# Patient Record
Sex: Female | Born: 1960 | Race: White | Hispanic: No | Marital: Married | Smoking: Never smoker
Health system: Southern US, Community
[De-identification: ages and names within clinical notes are randomized; demographics above are authoritative.]

## PROBLEM LIST (undated history)

## (undated) DIAGNOSIS — T8859XA Other complications of anesthesia, initial encounter: Secondary | ICD-10-CM

## (undated) DIAGNOSIS — T4145XA Adverse effect of unspecified anesthetic, initial encounter: Secondary | ICD-10-CM

## (undated) DIAGNOSIS — K635 Polyp of colon: Secondary | ICD-10-CM

## (undated) DIAGNOSIS — I8390 Asymptomatic varicose veins of unspecified lower extremity: Secondary | ICD-10-CM

## (undated) HISTORY — PX: PELVIC LAPAROSCOPY: SHX162

## (undated) HISTORY — DX: Asymptomatic varicose veins of unspecified lower extremity: I83.90

---

## 1999-09-09 ENCOUNTER — Emergency Department (HOSPITAL_COMMUNITY): Admission: EM | Admit: 1999-09-09 | Discharge: 1999-09-09 | Payer: Self-pay | Admitting: Emergency Medicine

## 2000-11-17 ENCOUNTER — Ambulatory Visit (HOSPITAL_COMMUNITY): Admission: RE | Admit: 2000-11-17 | Discharge: 2000-11-17 | Payer: Self-pay | Admitting: Gastroenterology

## 2002-10-03 ENCOUNTER — Other Ambulatory Visit: Admission: RE | Admit: 2002-10-03 | Discharge: 2002-10-03 | Payer: Self-pay | Admitting: *Deleted

## 2004-05-02 ENCOUNTER — Ambulatory Visit (HOSPITAL_COMMUNITY): Admission: RE | Admit: 2004-05-02 | Discharge: 2004-05-02 | Payer: Self-pay | Admitting: Obstetrics & Gynecology

## 2005-06-12 ENCOUNTER — Ambulatory Visit: Admission: RE | Admit: 2005-06-12 | Discharge: 2005-06-12 | Payer: Self-pay | Admitting: Obstetrics & Gynecology

## 2005-07-02 ENCOUNTER — Ambulatory Visit (HOSPITAL_COMMUNITY): Admission: RE | Admit: 2005-07-02 | Discharge: 2005-07-02 | Payer: Self-pay | Admitting: Obstetrics & Gynecology

## 2005-11-26 ENCOUNTER — Ambulatory Visit (HOSPITAL_COMMUNITY): Admission: RE | Admit: 2005-11-26 | Discharge: 2005-11-26 | Payer: Self-pay | Admitting: Obstetrics & Gynecology

## 2006-06-17 ENCOUNTER — Ambulatory Visit (HOSPITAL_COMMUNITY): Admission: RE | Admit: 2006-06-17 | Discharge: 2006-06-17 | Payer: Self-pay | Admitting: Obstetrics & Gynecology

## 2007-05-24 ENCOUNTER — Other Ambulatory Visit: Admission: RE | Admit: 2007-05-24 | Discharge: 2007-05-24 | Payer: Self-pay | Admitting: Obstetrics & Gynecology

## 2007-06-21 ENCOUNTER — Ambulatory Visit (HOSPITAL_COMMUNITY): Admission: RE | Admit: 2007-06-21 | Discharge: 2007-06-21 | Payer: Self-pay | Admitting: Family Medicine

## 2007-07-21 ENCOUNTER — Ambulatory Visit (HOSPITAL_COMMUNITY): Admission: RE | Admit: 2007-07-21 | Discharge: 2007-07-21 | Payer: Self-pay | Admitting: Family Medicine

## 2007-07-27 ENCOUNTER — Ambulatory Visit (HOSPITAL_COMMUNITY): Admission: RE | Admit: 2007-07-27 | Discharge: 2007-07-27 | Payer: Self-pay | Admitting: Family Medicine

## 2007-11-10 ENCOUNTER — Ambulatory Visit: Payer: Self-pay | Admitting: Internal Medicine

## 2007-11-24 ENCOUNTER — Ambulatory Visit: Payer: Self-pay | Admitting: Internal Medicine

## 2007-11-24 ENCOUNTER — Ambulatory Visit (HOSPITAL_COMMUNITY): Admission: RE | Admit: 2007-11-24 | Discharge: 2007-11-24 | Payer: Self-pay | Admitting: Internal Medicine

## 2007-11-24 HISTORY — PX: COLONOSCOPY: SHX5424

## 2008-02-14 ENCOUNTER — Ambulatory Visit (HOSPITAL_COMMUNITY): Admission: RE | Admit: 2008-02-14 | Discharge: 2008-02-14 | Payer: Self-pay | Admitting: Obstetrics & Gynecology

## 2008-08-17 ENCOUNTER — Ambulatory Visit (HOSPITAL_COMMUNITY): Admission: RE | Admit: 2008-08-17 | Discharge: 2008-08-17 | Payer: Self-pay | Admitting: Family Medicine

## 2009-09-17 ENCOUNTER — Ambulatory Visit (HOSPITAL_COMMUNITY): Admission: RE | Admit: 2009-09-17 | Discharge: 2009-09-17 | Payer: Self-pay | Admitting: Family Medicine

## 2010-08-04 ENCOUNTER — Encounter: Payer: Self-pay | Admitting: Family Medicine

## 2010-10-17 ENCOUNTER — Ambulatory Visit: Payer: Self-pay | Admitting: Gynecology

## 2010-10-31 ENCOUNTER — Ambulatory Visit (INDEPENDENT_AMBULATORY_CARE_PROVIDER_SITE_OTHER): Payer: BC Managed Care – PPO | Admitting: Gynecology

## 2010-10-31 ENCOUNTER — Other Ambulatory Visit: Payer: Self-pay | Admitting: Gynecology

## 2010-10-31 ENCOUNTER — Other Ambulatory Visit (HOSPITAL_COMMUNITY)
Admission: RE | Admit: 2010-10-31 | Discharge: 2010-10-31 | Disposition: A | Discharge status: Home / Self Care | Payer: BC Managed Care – PPO | Source: Ambulatory Visit | Attending: Gynecology | Admitting: Gynecology

## 2010-10-31 DIAGNOSIS — Z124 Encounter for screening for malignant neoplasm of cervix: Secondary | ICD-10-CM | POA: Insufficient documentation

## 2010-10-31 DIAGNOSIS — Z01419 Encounter for gynecological examination (general) (routine) without abnormal findings: Secondary | ICD-10-CM

## 2010-11-06 ENCOUNTER — Other Ambulatory Visit: Payer: Self-pay | Admitting: Gynecology

## 2010-11-06 DIAGNOSIS — Z139 Encounter for screening, unspecified: Secondary | ICD-10-CM

## 2010-11-11 ENCOUNTER — Ambulatory Visit (HOSPITAL_COMMUNITY)
Admission: RE | Admit: 2010-11-11 | Discharge: 2010-11-11 | Disposition: A | Discharge status: Home / Self Care | Payer: BC Managed Care – PPO | Source: Ambulatory Visit | Attending: Gynecology | Admitting: Gynecology

## 2010-11-11 DIAGNOSIS — Z1231 Encounter for screening mammogram for malignant neoplasm of breast: Secondary | ICD-10-CM | POA: Insufficient documentation

## 2010-11-11 DIAGNOSIS — Z139 Encounter for screening, unspecified: Secondary | ICD-10-CM

## 2010-11-26 NOTE — H&P (Signed)
NAME:  Sharon Hayes, Sharon Hayes             ACCOUNT NO.:  [id number]   MEDICAL RECORD NO.:  [id number]          PATIENT TYPE:  AMB   LOCATION:  DAY                           FACILITY:  [facility]   PHYSICIAN:  R. Roetta Sessions, M.D. DATE OF BIRTH:  10-22-1960   DATE OF ADMISSION:  DATE OF DISCHARGE:  LH                              HISTORY & PHYSICAL   CHIEF COMPLAINT:  Family history of colon cancer/iron deficiency.   HISTORY OF PRESENT ILLNESS:  Sharon Hayes is a 50 year old female who  self-referred for history of iron deficiency and family history of colon  cancer, requesting colonoscopy.  She denies any problems at this time,  but was told on September 07, 2007 blood draw that she had a low total  serum iron of 33.  Her hemoglobin was normal at 12.6, hematocrit normal  at 36.8, and MCV 92.3.  White blood cell and platelet counts were fine.  Her TSH was normal.  She denies any problems with rectal bleeding or  melena.  Denies any heavy menstrual bleeding.  Denies any abdominal  pain, nausea, vomiting, heartburn, indigestion, or anorexia.  Denies any  early satiety, dysphagia, or odynophagia.  Denies any diarrhea or  constipation.  Her weight has remained stable.  She did have her first  high-risk screening colonoscopy at age 25.  Her mother was diagnosed  with colon cancer at age 17, and died the following year.   PAST MEDICAL AND SURGICAL HISTORY:  She had a normal colonoscopy at age  89 by Dr. Madilyn Fireman in [place], per her report.  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   CURRENT MEDICATIONS:  Loestrin once daily and Allegra once daily.   ALLERGIES:  No known drug allergies.   FAMILY HISTORY:  Positive for mother with colon cancer diagnosed at age  30 and a sister with colonic adenomatous polyps.  Father deceased at age  37, secondary to pneumonia/CVA.  She has 3 healthy sisters.   SOCIAL HISTORY:  Sharon Hayes is married.  She  has 3 healthy children.  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  She denies any tobacco or drug use.  She consumes a couple of  glasses of wine every couple weeks or so.   REVIEW OF SYSTEMS:  GYN:  She has been on Loestrin for about 5 years.  She has a light flow.  She has regular 30-day menstrual cycles.  Otherwise negative review of systems.  See HPI.   PHYSICAL EXAMINATION:  VITAL SIGNS: Weight 150.5 pounds, height 67  inches, temperature 98.4, blood pressure 90/64, and pulse 56.  GENERAL: Sharon Hayes is a well-developed, well-nourished Caucasian  female in no acute distress.  HEENT: Sclerae clear, nonicteric.  Conjunctivae are pink.  Oropharynx is  pink and moist without lesions.  NECK: Supple without masses or thyromegaly.  CHEST: Heart regular rate and rhythm.  Normal S1 and S2.  No murmurs,  clicks, rubs, or gallops.  LUNGS: Clear to auscultation bilaterally.  ABDOMEN: Positive bowel sounds x4.  No bruits auscultated.  Soft,  nontender, and nondistended without palpable masses or  hepatosplenomegaly.  No rebound tenderness or guarding.  EXTREMITIES: Without clubbing or edema bilaterally.  SKIN: Pink, warm, and dry without any rash or jaundice.   LABORATORY STUDIES:  She had a CMP on September 07, 2007, which was  normal including LFTs.   IMPRESSION:  Sharon Hayes is a 50 year old female with a family history  of colon cancer, who is overdue for high-risk screening.  She also has  iron deficiency with a normal hemoglobin.  Denies any overt  gastrointestinal bleeding.  She is going to need colonoscopy to rule out  colorectal carcinoma.   PLAN:  Colonoscopy with Dr. Jena Gauss in the near future.  I discussed the  procedure including risks and benefits, which include but are not  limited to bleeding, infection, perforation, or drug reaction.  She  agrees with this plan and consent will be obtained.      Sharon Hayes, N.P.      Jonathon Bellows, M.D.  Electronically Signed    KJ/MEDQ  D:  11/10/2007  T:  11/11/2007  Job:  [id number]

## 2010-11-26 NOTE — Op Note (Signed)
NAME:  Sharon Hayes, Sharon Hayes             ACCOUNT NO.:  [id number]   MEDICAL RECORD NO.:  [id number]          PATIENT TYPE:  AMB   LOCATION:  DAY                           FACILITY:  [facility]   PHYSICIAN:  R. Roetta Sessions, M.D. DATE OF BIRTH:  1960/07/20   DATE OF PROCEDURE:  11/24/2007  DATE OF DISCHARGE:                               OPERATIVE REPORT   INDICATIONS FOR PROCEDURE:  The patient is a 50 year old lady with known  lower GI tract symptoms who has a positive family history of colon  cancer in her mother at young age and a sister with colonic adenomas who  has iron deficiency anemia.  Her last colonoscopy was done at age 59  down in [place] by Dr. Madilyn Fireman without significant findings.  Colonoscopy is now being done as high-risk screening maneuver.  This  approach has been discussed with the patient at length.  Potential  risks, benefits, and alternatives have been reviewed, questions  answered, and she is agreeable.  Please see documentation and medical  record.   PROCEDURE NOTE:  O2 saturation, blood pressure, pulse, and respirations  were monitored throughout the entire procedure.   CONSCIOUS SEDATION:  Versed 5 mg IV, Demerol 100 mg IV in divided doses.   INSTRUMENT:  Pentax video chip system.   FINDINGS:  Digital rectal exam revealed no abnormalities.   ENDOSCOPIC FINDINGS:  Prep was good.  Colon:  The colonic mucosa was  surveyed from the rectosigmoid junction through the left transverse,  right colon to the area of appendiceal orifice, the ileocecal valve, and  cecum.  These structures were well seen and photographed for the record.  Terminal ileum was noted to be 10 cm.  From this level, the scope slowly  and cautiously withdrawn.  All previously mentioned mucosal surfaces  were again seen.  The colonic mucosa appeared entirely normal as did the  terminal ileal mucosa.  The scope was pulled down into the rectum where  a thorough examination of the rectal mucosa was  undertaken.  The rectal  vault was somewhat small.  I attempted to retroflex but was unable to do  so, but for the same reason I was able see the rectal mucosa very well  en face.  She was found to have a 6-mm polyp at 15 cm of the anal verge,  otherwise the rectal mucosa appeared normal.  This polyp was removed  with hot snare cautery and was being recovered at the time of the  dictation.  The patient tolerated the procedure very well and was  reactive to endoscopy.   IMPRESSION:  Rectal polyp at 15 cm of the anal verge, status post  resection described above, otherwise normal rectum, colon, and terminal  ileum.   PLAN:  1. We will followup on path when becomes available.  At minimum, I      would recommend a repeat high-risk surveillance colonoscopy in 5      years.  2. Separate issue with iron-deficiency in her [place] origin.      We will go ahead and screen her for celiac disease with a celiac  antibody panel today.  3. Further recommendations to follow.      Jonathon Bellows, M.D.  Electronically Signed     RMR/MEDQ  D:  11/24/2007  T:  11/25/2007  Job:  [id number]   cc:   Marjory Lies, M.D.  Fax: [phone]

## 2010-11-29 NOTE — Procedures (Signed)
[place]  Patient:    Sharon Hayes, Sharon Hayes                      MRN: [id number] Proc. Date: 11/17/00 Adm. Date:  [id number] Attending:  Louie Bun CC:         Tammy R. Collins [place], M.D.   Procedure Report  PROCEDURE PERFORMED:  Colonoscopy  INDICATION FOR PROCEDURE:  Family history of colon cancer at an early age in a first degree relative.  DESCRIPTION OF PROCEDURE:  The patient was placed in the left lateral decubitus position and placed on the pulse monitor with continuous low flow oxygen delivered by nasal cannula.  She was sedated to 60 mg of IV Demerol and 6 mg IV Versed.  The Olympus video colonoscope was inserted into the rectum and advanced to the cecum, confirmed by transillumination of McBurneys point and visualization of the ileocecal valve and appendiceal orifice.  The prep was excellent.  The cecum, ascending, transverse, descending and sigmoid colon all appeared normal with no masses, polyps, diverticula or other mucosal abnormalities.  The rectum likewise appeared normal.  On retroflex view the anus revealed only small internal hemorrhoids.  The colonoscope was then withdrawn and the patient returned to the recovery room in stable condition. She tolerated the procedure well and there were no immediate complications.  IMPRESSION: 1. Small internal hemorrhoids. 2. Otherwise normal colonoscopy.  PLAN:  Repeat colonoscopy in five years. DD:  11/17/00 TD:  11/17/00 Job: [id number]

## 2011-11-11 ENCOUNTER — Other Ambulatory Visit: Payer: Self-pay | Admitting: Gynecology

## 2011-11-11 DIAGNOSIS — Z139 Encounter for screening, unspecified: Secondary | ICD-10-CM

## 2011-11-25 ENCOUNTER — Ambulatory Visit (HOSPITAL_COMMUNITY)
Admission: RE | Admit: 2011-11-25 | Discharge: 2011-11-25 | Disposition: A | Discharge status: Home / Self Care | Payer: BC Managed Care – PPO | Source: Ambulatory Visit | Attending: Gynecology | Admitting: Gynecology

## 2011-11-25 DIAGNOSIS — Z1231 Encounter for screening mammogram for malignant neoplasm of breast: Secondary | ICD-10-CM | POA: Insufficient documentation

## 2011-11-25 DIAGNOSIS — Z139 Encounter for screening, unspecified: Secondary | ICD-10-CM

## 2012-08-28 ENCOUNTER — Other Ambulatory Visit: Payer: Self-pay

## 2012-12-07 ENCOUNTER — Encounter: Payer: Self-pay | Admitting: Internal Medicine

## 2012-12-28 ENCOUNTER — Other Ambulatory Visit (HOSPITAL_COMMUNITY): Payer: Self-pay | Admitting: Oncology

## 2012-12-28 ENCOUNTER — Other Ambulatory Visit (HOSPITAL_COMMUNITY): Payer: Self-pay | Admitting: Family Medicine

## 2012-12-28 DIAGNOSIS — Z139 Encounter for screening, unspecified: Secondary | ICD-10-CM

## 2013-01-18 ENCOUNTER — Ambulatory Visit (HOSPITAL_COMMUNITY)
Admission: RE | Admit: 2013-01-18 | Discharge: 2013-01-18 | Disposition: A | Discharge status: Home / Self Care | Payer: BC Managed Care – PPO | Source: Ambulatory Visit | Attending: Family Medicine | Admitting: Family Medicine

## 2013-01-18 DIAGNOSIS — Z139 Encounter for screening, unspecified: Secondary | ICD-10-CM

## 2013-01-18 DIAGNOSIS — Z1231 Encounter for screening mammogram for malignant neoplasm of breast: Secondary | ICD-10-CM | POA: Insufficient documentation

## 2013-01-20 ENCOUNTER — Other Ambulatory Visit: Payer: Self-pay | Admitting: Family Medicine

## 2013-01-20 DIAGNOSIS — R928 Other abnormal and inconclusive findings on diagnostic imaging of breast: Secondary | ICD-10-CM

## 2013-02-02 ENCOUNTER — Other Ambulatory Visit: Payer: Self-pay | Admitting: Family Medicine

## 2013-02-02 ENCOUNTER — Ambulatory Visit (HOSPITAL_COMMUNITY)
Admission: RE | Admit: 2013-02-02 | Discharge: 2013-02-02 | Disposition: A | Discharge status: Home / Self Care | Payer: BC Managed Care – PPO | Source: Ambulatory Visit | Attending: Family Medicine | Admitting: Family Medicine

## 2013-02-02 ENCOUNTER — Other Ambulatory Visit (HOSPITAL_COMMUNITY): Payer: Self-pay | Admitting: Family Medicine

## 2013-02-02 DIAGNOSIS — R928 Other abnormal and inconclusive findings on diagnostic imaging of breast: Secondary | ICD-10-CM | POA: Insufficient documentation

## 2013-06-07 ENCOUNTER — Emergency Department (HOSPITAL_COMMUNITY)
Admission: EM | Admit: 2013-06-07 | Discharge: 2013-06-07 | Disposition: A | Discharge status: Home / Self Care | Payer: BC Managed Care – PPO | Source: Home / Self Care | Attending: Emergency Medicine | Admitting: Emergency Medicine

## 2013-06-07 ENCOUNTER — Encounter (HOSPITAL_COMMUNITY): Payer: Self-pay | Admitting: Emergency Medicine

## 2013-06-07 DIAGNOSIS — J029 Acute pharyngitis, unspecified: Secondary | ICD-10-CM

## 2013-06-07 DIAGNOSIS — J309 Allergic rhinitis, unspecified: Secondary | ICD-10-CM

## 2013-06-07 LAB — POCT RAPID STREP A: Streptococcus, Group A Screen (Direct): NEGATIVE

## 2013-06-07 NOTE — ED Provider Notes (Signed)
CSN: [id number]     Arrival date & time 06/07/13  1927 History   First MD Initiated Contact with Patient 06/07/13 2007     Chief Complaint  Patient presents with  . Sore Throat  . Headache   (Consider location/radiation/quality/duration/timing/severity/associated sxs/prior Treatment) HPI Comments: 52 yo female presents with allergy drainage, sore throat, mild headache and + exposure to strep. She denies fever. Allergy drainage is clear. She has been taking OTC ibuprofen with mild relief.   Patient is a 52 y.o. female presenting with pharyngitis and headaches.  Sore Throat Associated symptoms include headaches.  Headache Associated symptoms: drainage and sore throat     History reviewed. No pertinent past medical history. History reviewed. No pertinent past surgical history. History reviewed. No pertinent family history. History  Substance Use Topics  . Smoking status: Passive Smoke Exposure - Never Smoker  . Smokeless tobacco: Not on file  . Alcohol Use: Yes   OB History   Grav Para Term Preterm Abortions TAB SAB Ect Mult Living                 Review of Systems  HENT: Positive for postnasal drip and sore throat.   Neurological: Positive for headaches.  All other systems reviewed and are negative.    Allergies  Review of patient's allergies indicates no known allergies.  Home Medications  No current outpatient prescriptions on file. BP 107/64  Pulse 57  Temp(Src) 97.6 F (36.4 C) (Oral)  Resp 12  SpO2 100%  LMP 05/30/2013 Physical Exam  Nursing note and vitals reviewed. Constitutional: She is oriented to person, place, and time. She appears well-developed and well-nourished.  HENT:  Head: Normocephalic and atraumatic.  Right Ear: External ear normal.  Left Ear: External ear normal.  Nose: Nose normal.  Mouth/Throat: Oropharynx is clear and moist. No oropharyngeal exudate.  Clear exudate, minimal erythema, cobble stone appearance  Neck: Normal range of  motion.  Cardiovascular: Normal rate, regular rhythm, normal heart sounds and intact distal pulses.   Pulmonary/Chest: Effort normal and breath sounds normal.  Abdominal: Soft. Bowel sounds are normal. She exhibits no distension and no mass. There is no tenderness. There is no guarding.  Musculoskeletal: Normal range of motion.  Lymphadenopathy:    She has no cervical adenopathy.  Neurological: She is alert and oriented to person, place, and time.  Skin: Skin is warm and dry.  Psychiatric: Judgment normal.    ED Course  Procedures (including critical care time) Labs Review Labs Reviewed  POCT RAPID STREP A ([facility]RG CARE ONLY)   Imaging Review No results found.    MDM   1. Allergic rhinitis   2. Sore throat    Advised OTC ALlegra AD, warm salt water gargles, push H2O. Neg rapid strept will be sent for culture, advised of hygiene/ prevention. F/U if symptoms increase  Berenice Primas, PA-C 06/07/13 2046

## 2013-06-07 NOTE — ED Notes (Signed)
C/o sore throat and headache. Onset yesterday.  States has been around daughter that has strep.  Denies any other symptoms. No otc meds taken for symptoms.

## 2013-06-09 LAB — CULTURE, GROUP A STREP

## 2013-06-10 NOTE — ED Provider Notes (Signed)
Medical screening examination/treatment/procedure(s) were performed by a resident physician or non-physician practitioner and as the supervising physician I was immediately available for consultation/collaboration.  Clementeen Graham, MD    Rodolph Bong, MD 06/10/13 2000

## 2013-07-01 ENCOUNTER — Ambulatory Visit (INDEPENDENT_AMBULATORY_CARE_PROVIDER_SITE_OTHER): Payer: BC Managed Care – PPO | Admitting: Gynecology

## 2013-07-01 ENCOUNTER — Encounter: Payer: Self-pay | Admitting: Gynecology

## 2013-07-01 ENCOUNTER — Other Ambulatory Visit (HOSPITAL_COMMUNITY)
Admission: RE | Admit: 2013-07-01 | Discharge: 2013-07-01 | Disposition: A | Discharge status: Home / Self Care | Payer: BC Managed Care – PPO | Source: Ambulatory Visit | Attending: Gynecology | Admitting: Gynecology

## 2013-07-01 VITALS — BP 118/76 | Ht 66.5 in | Wt 156.4 lb

## 2013-07-01 DIAGNOSIS — N951 Menopausal and female climacteric states: Secondary | ICD-10-CM

## 2013-07-01 DIAGNOSIS — Z01419 Encounter for gynecological examination (general) (routine) without abnormal findings: Secondary | ICD-10-CM

## 2013-07-01 DIAGNOSIS — N9489 Other specified conditions associated with female genital organs and menstrual cycle: Secondary | ICD-10-CM

## 2013-07-01 DIAGNOSIS — N898 Other specified noninflammatory disorders of vagina: Secondary | ICD-10-CM

## 2013-07-01 DIAGNOSIS — N889 Noninflammatory disorder of cervix uteri, unspecified: Secondary | ICD-10-CM

## 2013-07-01 DIAGNOSIS — Z1151 Encounter for screening for human papillomavirus (HPV): Secondary | ICD-10-CM | POA: Insufficient documentation

## 2013-07-01 NOTE — Progress Notes (Signed)
Sharon Hayes 1960-07-19 [id number]   History:    52 y.o.  for annual gyn exam Who has not been seen in the office since 2012. Patient has not been using any form of contraception. The patient is currently menstruating once a month regularly. Patient had an abnormal mammogram screening early this year with follow  up with diagnostic mammogram and appeared to be an asymmetrical fibroglandular dense tissue on the left breast and she is scheduled for followup in 6 months. 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 Patient states her colonoscopy was 6 years ago. Her PCP is Dr. Doristine Counter who has been doing her blood work. Patient's colonoscopy was reported to be normal 6 years ago. Her mother had history of colon cancer at the age of 69.  Past medical history,surgical history, family history and social history were all reviewed and documented in the EPIC chart.  Gynecologic History Patient's last menstrual period was 05/30/2013. Contraception: none Last Pap: 2012. Results were: normal Last mammogram: see above. Results were: see above  Obstetric History OB History  Gravida Para Term Preterm AB SAB TAB Ectopic Multiple Living  4 3   1 1    3     # Outcome Date GA Lbr Len/2nd Weight Sex Delivery Anes PTL Lv  4 SAB           3 PAR           2 PAR           1 PAR                ROS: A ROS was performed and pertinent positives and negatives are included in the history.  GENERAL: No fevers or chills. HEENT: No change in vision, no earache, sore throat or sinus congestion. NECK: No pain or stiffness. CARDIOVASCULAR: No chest pain or pressure. No palpitations. PULMONARY: No shortness of breath, cough or wheeze. GASTROINTESTINAL: No abdominal pain, nausea, vomiting or diarrhea, melena or bright red blood per rectum. GENITOURINARY: No urinary frequency, urgency, hesitancy or dysuria. MUSCULOSKELETAL: No joint or muscle  pain, no back pain, no recent trauma. DERMATOLOGIC: No rash, no itching, no lesions. ENDOCRINE: No polyuria, polydipsia, no heat or cold intolerance. No recent change in weight. HEMATOLOGICAL: No anemia or easy bruising or bleeding. NEUROLOGIC: No headache, seizures, numbness, tingling or weakness. PSYCHIATRIC: No depression, no loss of interest in normal activity or change in sleep pattern.     Exam: chaperone present  BP 118/76  Ht 5' 6.5" (1.689 m)  Wt 156 lb 6.4 oz (70.943 kg)  BMI 24.87 kg/m2  LMP 05/30/2013  Body mass index is 24.87 kg/(m^2).  General appearance : Well developed well nourished female. No acute distress HEENT: Neck supple, trachea midline, no carotid bruits, no thyroidmegaly Lungs: Clear to auscultation, no rhonchi or wheezes, or rib retractions  Heart: Regular rate and rhythm, no murmurs or gallops Breast:Examined in sitting and supine position were symmetrical in appearance, no palpable masses or tenderness,  no skin retraction, no nipple inversion, no nipple discharge, no skin discoloration, no axillary or supraclavicular lymphadenopathy Abdomen: no palpable masses or tenderness, no rebound or guarding Extremities: no edema or skin discoloration or tenderness  Pelvic:  Bartholin, Urethra, Skene Glands: Within normal limits             Vagina: No gross lesions or discharge  Cervix::   Physical Exam  Genitourinary:  and            Uterus  anteverted, normal size, shape and consistency, non-tender and mobile  Adnexa  Without masses or tenderness  Anus and perineum  normal   Rectovaginal  normal sphincter tone without palpated masses or tenderness             Hemoccult cards provided     Assessment/Plan:  52 y.o. 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 Patient will be asked to return to the office in the next few weeks for a colposcopic evaluation. A Pap smear was done today despite the new  guidelines and her having a normal Pap smear in 2012. She was reminded to schedule for colonoscopy because of her strong family history of colon cancer. Hemoccult cards were provided for her to symmetrically office for testing. We will hold off any hormonal treatment for vaginal atrophy until we complete the colposcopic evaluation. She was reminded of the importance of monthly breast exams and to followup with the previously recommended diagnostic mammogram of the left breast. We discussed the importance of calcium and vitamin D in regular exercise for osteoporosis prevention.  Note: This dictation was prepared with  Dragon/digital dictation along withSmart phrase technology. Any transcriptional errors that result from this process are unintentional.   Ok Edwards MD, 1:28 PM 07/01/2013

## 2013-07-01 NOTE — Patient Instructions (Signed)
Hormone Therapy At menopause, your body begins making less estrogen and progesterone hormones. This causes the body to stop having menstrual periods. This is because estrogen and progesterone hormones control your periods and menstrual cycle. A lack of estrogen may cause symptoms such as:  Hot flushes (or hot flashes).  Vaginal dryness.  Dry skin.  Loss of sex drive.  Risk of bone loss (osteoporosis). When this happens, you may choose to take hormone therapy to get back the estrogen lost during menopause. When the hormone estrogen is given alone, it is usually referred to as ET (Estrogen Therapy). When the hormone progestin is combined with estrogen, it is generally called HT (Hormone Therapy). This was formerly known as hormone replacement therapy (HRT). Your caregiver can help you make a decision on what will be best for you. The decision to use HT seems to change often as new studies are done. Many studies do not agree on the benefits of hormone replacement therapy. LIKELY BENEFITS OF HT INCLUDE PROTECTION FROM:  Hot Flushes (also called hot flashes) - A hot flush is a sudden feeling of heat that spreads over the face and body. The skin may redden like a blush. It is connected with sweats and sleep disturbance. Women going through menopause may have hot flushes a few times a month or several times per day depending on the woman.  Osteoporosis (bone loss)- Estrogen helps guard against bone loss. After menopause, a woman's bones slowly lose calcium and become weak and brittle. As a result, bones are more likely to break. The hip, wrist, and spine are affected most often. Hormone therapy can help slow bone loss after menopause. Weight bearing exercise and taking calcium with vitamin D also can help prevent bone loss. There are also medications that your caregiver can prescribe that can help prevent osteoporosis.  Vaginal Dryness - Loss of estrogen causes changes in the vagina. Its lining may  become thin and dry. These changes can cause pain and bleeding during sexual intercourse. Dryness can also lead to infections. This can cause burning and itching. (Vaginal estrogen treatment can help relieve pain, itching, and dryness.)  Urinary Tract Infections are more common after menopause because of lack of estrogen. Some women also develop urinary incontinence because of low estrogen levels in the vagina and bladder.  Possible other benefits of estrogen include a positive effect on mood and short-term memory in women. RISKS AND COMPLICATIONS  Using estrogen alone without progesterone causes the lining of the uterus to grow. This increases the risk of lining of the uterus (endometrial) cancer. Your caregiver should give another hormone called progestin if you have a uterus.  Women who take combined (estrogen and progestin) HT appear to have an increased risk of breast cancer. The risk appears to be small, but increases throughout the time that HT is taken.  Combined therapy also makes the breast tissue slightly denser which makes it harder to read mammograms (breast X-rays).  Combined, estrogen and progesterone therapy can be taken together every day, in which case there may be spotting of blood. HT therapy can be taken cyclically in which case you will have menstrual periods. Cyclically means HT is taken for a set amount of days, then not taken, then this process is repeated.  HT may increase the risk of stroke, heart attack, breast cancer and forming blood clots in your leg.  Transdermal estrogen (estrogen that is absorbed through the skin with a patch or a cream) may have more positive results with:    Cholesterol.  Blood pressure.  Blood clots. Having the following conditions may indicate you should not have HT:  Endometrial cancer.  Liver disease.  Breast cancer.  Heart disease.  History of blood clots.  Stroke. TREATMENT   If you choose to take HT and have a uterus,  usually estrogen and progestin are prescribed.  Your caregiver will help you decide the best way to take the medications.  Possible ways to take estrogen include:  Pills.  Patches.  Gels.  Sprays.  Vaginal estrogen cream, rings and tablets.  It is best to take the lowest dose possible that will help your symptoms and take them for the shortest period of time that you can.  Hormone therapy can help relieve some of the problems (symptoms) that affect women at menopause. Before making a decision about HT, talk to your caregiver about what is best for you. Be well informed and comfortable with your decisions. HOME CARE INSTRUCTIONS   Follow your caregivers advice when taking the medications.  A Pap test is done to screen for cervical cancer.  The first Pap test should be done at age 21.  Between ages 21 and 29, Pap tests are repeated every 2 years.  Beginning at age 30, you are advised to have a Pap test every 3 years as long as your past 3 Pap tests have been normal.  Some women have medical problems that increase the chance of getting cervical cancer. Talk to your caregiver about these problems. It is especially important to talk to your caregiver if a new problem develops soon after your last Pap test. In these cases, your caregiver may recommend more frequent screening and Pap tests.  The above recommendations are the same for women who have or have not gotten the vaccine for HPV (Human Papillomavirus).  If you had a hysterectomy for a problem that was not a cancer or a condition that could lead to cancer, then you no longer need Pap tests. However, even if you no longer need a Pap test, a regular exam is a good idea to make sure no other problems are starting.   If you are between ages 65 and 70, and you have had normal Pap tests going back 10 years, you no longer need Pap tests. However, even if you no longer need a Pap test, a regular exam is a good idea to make sure no  other problems are starting.   If you have had past treatment for cervical cancer or a condition that could lead to cancer, you need Pap tests and screening for cancer for at least 20 years after your treatment.  If Pap tests have been discontinued, risk factors (such as a new sexual partner) need to be re-assessed to determine if screening should be resumed.  Some women may need screenings more often if they are at high risk for cervical cancer.  Get mammograms done as per the advice of your caregiver. SEEK IMMEDIATE MEDICAL CARE IF:  You develop abnormal vaginal bleeding.  You have pain or swelling in your legs, shortness of breath, or chest pain.  You develop dizziness or headaches.  You have lumps or changes in your breasts or armpits.  You have slurred speech.  You develop weakness or numbness of your arms or legs.  You have pain, burning, or bleeding when urinating.  You develop abdominal pain. Document Released: 03/29/2003 Document Revised: 09/22/2011 Document Reviewed: 07/17/2010 Exit[employer] Patient Information 2014 [employer].   Shingles Vaccine What You   Need to Know WHAT IS SHINGLES?  Shingles is a painful skin rash, often with blisters. It is also called Herpes Zoster or just Zoster.  A shingles rash usually appears on one side of the face or body and lasts from 2 to 4 weeks. Its main symptom is pain, which can be quite severe. Other symptoms of shingles can include fever, headache, chills, and upset stomach. Very rarely, a shingles infection can lead to pneumonia, hearing problems, blindness, brain inflammation (encephalitis), or death.  For about 1 person in 5, severe pain can continue even after the rash clears up. This is called post-herpetic neuralgia.  Shingles is caused by the Varicella Zoster virus. This is the same virus that causes chickenpox. Only someone who has had a case of chickenpox or rarely, has gotten chickenpox vaccine, can get shingles.  The virus stays in your body. It can reappear many years later to cause a case of shingles.  You cannot catch shingles from another person with shingles. However, a person who has never had chickenpox (or chickenpox vaccine) could get chickenpox from someone with shingles. This is not very common.  Shingles is far more common in people 50 and older than in younger people. It is also more common in people whose immune systems are weakened because of a disease such as cancer or drugs such as steroids or chemotherapy.  At least 1 million people get shingles per year in the [place]. SHINGLES VACCINE  A vaccine for shingles was licensed in 2006. In clinical trials, the vaccine reduced the risk of shingles by 50%. It can also reduce the pain in people who still get shingles after being vaccinated.  A single dose of shingles vaccine is recommended for adults 60 years of age and older. SOME PEOPLE SHOULD NOT GET SHINGLES VACCINE OR SHOULD WAIT A person should not get shingles vaccine if he or she:  Has ever had a life-threatening allergic reaction to gelatin, the antibiotic neomycin, or any other component of shingles vaccine. Tell your caregiver if you have any severe allergies.  Has a weakened immune system because of current:  AIDS or another disease that affects the immune system.  Treatment with drugs that affect the immune system, such as prolonged use of high-dose steroids.  Cancer treatment, such as radiation or chemotherapy.  Cancer affecting the bone marrow or lymphatic system, such as leukemia or lymphoma.  Is pregnant, or might be pregnant. Women should not become pregnant until at least 4 weeks after getting shingles vaccine. Someone with a minor illness, such as a cold, may be vaccinated. Anyone with a moderate or severe acute illness should usually wait until he or she recovers before getting the vaccine. This includes anyone with a temperature of 101.3 F (38 C) or  higher. WHAT ARE THE RISKS FROM SHINGLES VACCINE?  A vaccine, like any medicine, could possibly cause serious problems, such as severe allergic reactions. However, the risk of a vaccine causing serious harm, or death, is extremely small.  No serious problems have been identified with shingles vaccine. Mild Problems  Redness, soreness, swelling, or itching at the site of the injection (about 1 person in 3).  Headache (about 1 person in 70). Like all vaccines, shingles vaccine is being closely monitored for unusual or severe problems. WHAT IF THERE IS A MODERATE OR SEVERE REACTION? What should I look for? Any unusual condition, such as a severe allergic reaction or a high fever. If a severe allergic reaction occurred, it would   be within a few minutes to an hour after the shot. Signs of a serious allergic reaction can include difficulty breathing, weakness, hoarseness or wheezing, a fast heartbeat, hives, dizziness, paleness, or swelling of the throat. What should I do?  Call your caregiver, or get the person to a caregiver right away.  Tell the caregiver what happened, the date and time it happened, and when the vaccination was given.  Ask the caregiver to report the reaction by filing a Vaccine Adverse Event Reporting System (VAERS) form. Or, you can file this report through the VAERS web site at www.vaers.hhs.gov or by calling 1-800-822-7967. VAERS does not provide medical advice. HOW CAN I LEARN MORE?  Ask your caregiver. He or she can give you the vaccine package insert or suggest other sources of information.  Contact the Centers for Disease Control and Prevention (CDC):  Call 1-800-232-4636 (1-800-CDC-INFO).  Visit the CDC website at www.cdc.gov/vaccines CDC Shingles Vaccine VIS (04/18/08) Document Released: 04/27/2006 Document Revised: 09/22/2011 Document Reviewed: 10/20/2012 ExitCare Patient Information 2014 [employer].   

## 2013-07-11 ENCOUNTER — Other Ambulatory Visit (HOSPITAL_COMMUNITY): Payer: Self-pay | Admitting: Family Medicine

## 2013-07-11 DIAGNOSIS — Z139 Encounter for screening, unspecified: Secondary | ICD-10-CM

## 2013-08-10 ENCOUNTER — Ambulatory Visit (HOSPITAL_COMMUNITY)
Admission: RE | Admit: 2013-08-10 | Discharge: 2013-08-10 | Disposition: A | Discharge status: Home / Self Care | Payer: BC Managed Care – PPO | Source: Ambulatory Visit | Attending: Family Medicine | Admitting: Family Medicine

## 2013-08-10 DIAGNOSIS — N6489 Other specified disorders of breast: Secondary | ICD-10-CM | POA: Insufficient documentation

## 2013-08-10 DIAGNOSIS — Z09 Encounter for follow-up examination after completed treatment for conditions other than malignant neoplasm: Secondary | ICD-10-CM | POA: Insufficient documentation

## 2013-08-10 DIAGNOSIS — Z139 Encounter for screening, unspecified: Secondary | ICD-10-CM

## 2013-08-31 ENCOUNTER — Ambulatory Visit (INDEPENDENT_AMBULATORY_CARE_PROVIDER_SITE_OTHER): Payer: BC Managed Care – PPO | Admitting: Gynecology

## 2013-08-31 ENCOUNTER — Encounter: Payer: Self-pay | Admitting: Gynecology

## 2013-08-31 VITALS — BP 112/70

## 2013-08-31 DIAGNOSIS — N889 Noninflammatory disorder of cervix uteri, unspecified: Secondary | ICD-10-CM

## 2013-08-31 NOTE — Progress Notes (Addendum)
   The patient presented to the office today for colposcopic evaluation as a result of an incidental finding at time of her annual exam in 04/01/2013. It was noted during the pelvic exam did there appear to be a leukoplakic area at the 12:00 position of the ectocervix. 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 The most recent Pap smear done December 2000 4T was normal.  Patient underwent a detail colposcopic evaluation today after applying acetic acid into the vagina the entire vaginal tube was analyzed. There was no lesions noted on the fornices. The endocervical speculum was utilized and the transformation zone was visualized completely. There was no lesions seen. The external genitalia, perineum and pararectal region were inspected as well and no lesions noted.  Patient was reassured. We'll continue to follow her with Pap smears every 3 years according to the new guidelines.

## 2013-08-31 NOTE — Patient Instructions (Signed)
Colposcopy  Colposcopy is a procedure to examine your cervix and vagina, or the area around the outside of your vagina, for abnormalities or signs of disease. The procedure is done using a lighted microscope called a colposcope. Tissue samples may be collected during the colposcopy if your health care provider finds any unusual cells. A colposcopy may be done if a woman has:   An abnormal Pap test. A Pap test is a medical test done to evaluate cells that are on the surface of the cervix.   A Pap test result that is suggestive of human papillomavirus (HPV). This virus can cause genital warts and is linked to the development of cervical cancer.   A sore on her cervix and the results of a Pap test were normal.   Genital warts on the cervix or in or around the outside of the vagina.   A mother who took the drug diethylstilbestrol (DES) while pregnant.   Painful intercourse.   Vaginal bleeding, especially after sexual intercourse.  LET YOUR HEALTH CARE PROVIDER KNOW ABOUT:   Any allergies you have.   All medicines you are taking, including vitamins, herbs, eye drops, creams, and over-the-counter medicines.   Previous problems you or members of your family have had with the use of anesthetics.   Any blood disorders you have.   Previous surgeries you have had.   Medical conditions you have.  RISKS AND COMPLICATIONS  Generally, a colposcopy is a safe procedure. However, as with any procedure, complications can occur. Possible complications include:   Bleeding.   Infection.   Missed lesions.  BEFORE THE PROCEDURE    Tell your health care provider if you have your menstrual period. A colposcopy typically is not done during menstruation.   For 24 hours before the colposcopy, do not:   Douche.   Use tampons.   Use medicines, creams, or suppositories in the vagina.   Have sexual intercourse.  PROCEDURE   During the procedure, you will be lying on your back with your feet in foot rests (stirrups). A warm  metal or plastic instrument (speculum) will be placed in your vagina to keep it open and to allow the health care provider to see the cervix. The colposcope will be placed outside the vagina. It will be used to magnify and examine the cervix, vagina, and the area around the outside of the vagina. A small amount of liquid solution will be placed on the area that is to be viewed. This solution will make it easier to see the abnormal cells. Your health care provider will use tools to suck out mucus and cells from the canal of the cervix. Then he or she will record the location of the abnormal areas.  If a biopsy is done during the procedure, a medicine will usually be given to numb the area (local anesthetic). You may feel mild pain or cramping while the biopsy is done. After the procedure, tissue samples collected during the biopsy will be sent to a lab for analysis.  AFTER THE PROCEDURE   You will be given instructions on when to follow up with your health care provider for your test results. It is important to keep your appointment.  Document Released: 09/20/2002 Document Revised: 03/02/2013 Document Reviewed: 01/27/2013  ExitCare Patient Information 2014 [employer].

## 2014-02-15 ENCOUNTER — Other Ambulatory Visit (HOSPITAL_COMMUNITY): Payer: Self-pay | Admitting: Family Medicine

## 2014-02-15 DIAGNOSIS — N6489 Other specified disorders of breast: Secondary | ICD-10-CM

## 2014-02-15 DIAGNOSIS — Z09 Encounter for follow-up examination after completed treatment for conditions other than malignant neoplasm: Secondary | ICD-10-CM

## 2014-03-14 ENCOUNTER — Ambulatory Visit (HOSPITAL_COMMUNITY)
Admission: RE | Admit: 2014-03-14 | Discharge: 2014-03-14 | Disposition: A | Discharge status: Home / Self Care | Payer: BC Managed Care – PPO | Source: Ambulatory Visit | Attending: Family Medicine | Admitting: Family Medicine

## 2014-03-14 DIAGNOSIS — R928 Other abnormal and inconclusive findings on diagnostic imaging of breast: Secondary | ICD-10-CM | POA: Diagnosis present

## 2014-03-14 DIAGNOSIS — Z09 Encounter for follow-up examination after completed treatment for conditions other than malignant neoplasm: Secondary | ICD-10-CM

## 2014-03-14 DIAGNOSIS — N6489 Other specified disorders of breast: Secondary | ICD-10-CM

## 2014-04-28 ENCOUNTER — Other Ambulatory Visit: Payer: Self-pay

## 2014-05-15 ENCOUNTER — Encounter: Payer: Self-pay | Admitting: Gynecology

## 2014-09-12 ENCOUNTER — Other Ambulatory Visit (HOSPITAL_COMMUNITY): Payer: Self-pay | Admitting: Family Medicine

## 2014-09-12 DIAGNOSIS — Z09 Encounter for follow-up examination after completed treatment for conditions other than malignant neoplasm: Secondary | ICD-10-CM

## 2014-10-03 ENCOUNTER — Ambulatory Visit (HOSPITAL_COMMUNITY)
Admission: RE | Admit: 2014-10-03 | Discharge: 2014-10-03 | Disposition: A | Discharge status: Home / Self Care | Payer: 59 | Source: Ambulatory Visit | Attending: Family Medicine | Admitting: Family Medicine

## 2014-10-03 DIAGNOSIS — Z09 Encounter for follow-up examination after completed treatment for conditions other than malignant neoplasm: Secondary | ICD-10-CM

## 2014-10-03 DIAGNOSIS — R928 Other abnormal and inconclusive findings on diagnostic imaging of breast: Secondary | ICD-10-CM | POA: Insufficient documentation

## 2014-11-20 ENCOUNTER — Other Ambulatory Visit: Payer: Self-pay | Admitting: *Deleted

## 2014-11-20 ENCOUNTER — Other Ambulatory Visit: Payer: Self-pay

## 2014-11-20 ENCOUNTER — Ambulatory Visit (INDEPENDENT_AMBULATORY_CARE_PROVIDER_SITE_OTHER): Payer: 59 | Admitting: Nurse Practitioner

## 2014-11-20 ENCOUNTER — Encounter: Payer: Self-pay | Admitting: Nurse Practitioner

## 2014-11-20 VITALS — BP 105/68 | HR 52 | Temp 97.0°F | Ht 67.0 in | Wt 159.4 lb

## 2014-11-20 DIAGNOSIS — I83893 Varicose veins of bilateral lower extremities with other complications: Secondary | ICD-10-CM

## 2014-11-20 DIAGNOSIS — Z86010 Personal history of colonic polyps: Secondary | ICD-10-CM

## 2014-11-20 MED ORDER — PEG-KCL-NACL-NASULF-NA ASC-C 100 G PO SOLR: 1.0000 | ORAL | Status: DC

## 2014-11-20 NOTE — Progress Notes (Signed)
Primary Care Physician:  Stephens Shire, MD Primary Gastroenterologist:  Dr. Gala Romney  Chief Complaint  Patient presents with  . set up TCS    HPI:   54 year old female presents for surveillance colonoscopy. Patient is high risk due to family history including colon cancer in her mother at a young age and a sister with colonic adenomas. Last colonoscopy 2009 found prep good, rectal polyp that was 15 cm from the anal verge, otherwise normal rectum, colon, and terminal ileum. Recommended high risk surveillance colonoscopy minimum 5 years. Also of note, no path report is available in the system.  Today she states she is doing well. Denies abdominal pain, nausea, vomiting, chest pain, dyspnea, hematochezia, melena. Also denies fever/chills, unintentional weight loss, change in bowel habits. Typically has a bowel movement once every day to every 2 days which is consistent with [facility] 4 and no straining needed. Denies any upper or lower GI symptoms. Of note patient states she has no medical history.  No past medical history on file.  Past Surgical History  Procedure Laterality Date  . Pelvic laparoscopy    . Colonoscopy  11/24/07    [id number] polyp at 15 cm anal verge otherwise normal    No current outpatient prescriptions on file.   No current facility-administered medications for this visit.    Allergies as of 11/20/2014  . (No Known Allergies)    Family History  Problem Relation Age of Onset  . Cancer Mother     colon  . Heart disease Father   . Cancer Maternal Aunt     uterine  . Breast cancer Maternal Aunt     History   Social History  . Marital Status: Married    Spouse Name: N/A  . Number of Children: N/A  . Years of Education: N/A   Occupational History  . Not on file.   Social History Main Topics  . Smoking status: Passive Smoke Exposure - Never Smoker  . Smokeless tobacco: Not on file  . Alcohol Use: Yes  . Drug Use: No  . Sexual Activity: No    Other Topics Concern  . Not on file   Social History Narrative    Review of Systems: General: Negative for anorexia, weight loss, fever, chills, fatigue, weakness. Eyes: Negative for vision changes.  ENT: Negative for hoarseness, difficulty swallowing. CV: Negative for chest pain, angina, palpitations, peripheral edema.  Respiratory: Negative for dyspnea at rest, cough, sputum, wheezing.  GI: See history of present illness. Derm: Negative for rash or itching.  Neuro: Negative for weakness, seizure, memory loss, confusion.  Psych: Negative for anxiety, depression.  Endo: Negative for unusual weight change.  Heme: Negative for bruising or bleeding. Allergy: Negative for rash or hives.    Physical Exam: BP 105/68 mmHg  Pulse 52  Temp(Src) 97 F (36.1 C)  Ht 5\' 7"  (1.702 m)  Wt 159 lb 6.4 oz (72.303 kg)  BMI 24.96 kg/m2  LMP 09/12/2014 General:   Alert and oriented. Pleasant and cooperative. Well-nourished and well-developed.  Head:  Normocephalic and atraumatic. Eyes:  Without icterus, sclera clear and conjunctiva pink.  Ears:  Normal auditory acuity. Neck:  Supple, without mass or thyromegaly. Lungs:  Clear to auscultation bilaterally. No wheezes, rales, or rhonchi. No distress.  Heart:  S1, S2 present without murmurs appreciated.  Abdomen:  +BS, soft, non-tender and non-distended. No HSM noted. No guarding or rebound. No masses appreciated.  Rectal:  Deferred  Msk:  Symmetrical without gross deformities. Normal  posture. Extremities:  Without clubbing or edema. Neurologic:  Alert and  oriented x4;  grossly normal neurologically. Skin:  Intact without significant lesions or rashes. Psych:  Alert and cooperative. Normal mood and affect.     11/20/2014 8:19 AM

## 2014-11-20 NOTE — Patient Instructions (Signed)
1. We will schedule your procedure (colonoscopy) for you. 2. Further recommendations to be based on results of your procedure.

## 2014-11-20 NOTE — Assessment & Plan Note (Signed)
Patient with a high risk for colon cancer due to family history including a mother diagnosed at age 54 with colon cancer as well as a sister with adenomatous polyps. Patient has had 2 colonoscopies starting at age 89, last colonoscopy with a single rectal polyp with pathology unavailable. Recommend 5 year repeat. Patient is currently asymptomatic from a GI standpoint. We will proceed with a surveillance colonoscopy on this high risk individual.  Proceed with TCS with Dr. Gala Romney in near future: the risks, benefits, and alternatives have been discussed with the patient in detail. The patient states understanding and desires to proceed.  Patient is not on any anticoagulants, antidiabetic, long-term pain or anxiety medications.

## 2014-11-23 NOTE — Progress Notes (Signed)
cc'ed to pcp °

## 2014-11-27 ENCOUNTER — Encounter: Payer: Self-pay | Admitting: Vascular Surgery

## 2014-11-28 ENCOUNTER — Other Ambulatory Visit: Payer: Self-pay | Admitting: Vascular Surgery

## 2014-11-28 DIAGNOSIS — I8390 Asymptomatic varicose veins of unspecified lower extremity: Secondary | ICD-10-CM

## 2014-11-29 ENCOUNTER — Ambulatory Visit (HOSPITAL_COMMUNITY)
Admission: RE | Admit: 2014-11-29 | Discharge: 2014-11-29 | Disposition: A | Discharge status: Home / Self Care | Payer: 59 | Source: Ambulatory Visit | Attending: Vascular Surgery | Admitting: Vascular Surgery

## 2014-11-29 ENCOUNTER — Encounter: Payer: Self-pay | Admitting: Vascular Surgery

## 2014-11-29 ENCOUNTER — Ambulatory Visit (INDEPENDENT_AMBULATORY_CARE_PROVIDER_SITE_OTHER): Payer: 59 | Admitting: Vascular Surgery

## 2014-11-29 VITALS — BP 116/68 | HR 47 | Ht 67.0 in | Wt 154.2 lb

## 2014-11-29 DIAGNOSIS — I8393 Asymptomatic varicose veins of bilateral lower extremities: Secondary | ICD-10-CM | POA: Diagnosis not present

## 2014-11-29 DIAGNOSIS — I8390 Asymptomatic varicose veins of unspecified lower extremity: Secondary | ICD-10-CM

## 2014-11-29 DIAGNOSIS — I83893 Varicose veins of bilateral lower extremities with other complications: Secondary | ICD-10-CM

## 2014-11-29 DIAGNOSIS — I868 Varicose veins of other specified sites: Secondary | ICD-10-CM

## 2014-11-29 NOTE — Progress Notes (Signed)
[facility] [place]  Patient name: Sharon Hayes MRN: [id number] DOB: 09-10-1960 Sex: female  REASON FOR CONSULT: Painful varicose veins bilaterally.  HPI: Sharon Hayes is a 54 y.o. 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 She experiences achiness and heaviness in both lower extremities which is aggravated by standing and sitting and relieved with elevation. She denies any previous history of DVT or phlebitis. Her symptoms are more significant on the left side. She states that she has been wearing compression stockings for 20-30 years. She currently wears thigh-high 30-40 mmHg. He is prescribed by her primary care physician. Her symptoms gradually progressed and she wishes to consider her options.  Her past medical history is unremarkable. She denies any history of diabetes, hypertension, hypercholesterolemia, or history of cardiac disease.   No past medical history on file. Family History  Problem Relation Age of Onset  . Colon cancer Mother 22    colon  . Cancer Mother   . Varicose Veins Mother   . Heart disease Father   . Hyperlipidemia Father   . Hypertension Father   . Heart attack Father   . Cancer Maternal Aunt     uterine  . Breast cancer Maternal Aunt   . Colon polyps Sister    SOCIAL HISTORY: History  Substance Use Topics  . Smoking status: Passive Smoke Exposure - Never Smoker  . Smokeless tobacco: Never Used  . Alcohol Use: 1.2 oz/week    2 Shots of liquor, 0 Standard drinks or equivalent per week     Comment: 1-2 glasses of wine approx 4 days a week   No Known Allergies Current Outpatient Prescriptions  Medication Sig Dispense Refill  . peg 3350 powder (MOVIPREP) 100 G SOLR Take 1 kit (200 g total) by mouth as directed. (Patient not taking: Reported on 11/29/2014) 1 kit 0   No current facility-administered medications for this visit.   REVIEW OF SYSTEMS: Valu.Nieves ] denotes positive  finding; [  ] denotes negative finding  CARDIOVASCULAR:  [ ]  chest pain   [ ]  chest pressure   [ ]  palpitations   [ ]  orthopnea   [ ]  dyspnea on exertion   [ ]  claudication   [ ]  rest pain   [ ]  DVT   [ ]  phlebitis PULMONARY:   [ ]  productive cough   [ ]  asthma   [ ]  wheezing NEUROLOGIC:   [ ]  weakness  [ ]  paresthesias  [ ]  aphasia  [ ]  amaurosis  [ ]  dizziness HEMATOLOGIC:   [ ]  bleeding problems   [ ]  clotting disorders MUSCULOSKELETAL:  [ ]  joint pain   [ ]  joint swelling Valu.Nieves ] leg swelling GASTROINTESTINAL: [ ]   blood in stool  [ ]   hematemesis GENITOURINARY:  [ ]   dysuria  [ ]   hematuria PSYCHIATRIC:  [ ]  history of major depression INTEGUMENTARY:  [ ]  rashes  [ ]  ulcers CONSTITUTIONAL:  [ ]  fever   [ ]  chills  PHYSICAL EXAM: Filed Vitals:   11/29/14 1023  BP: 116/68  Pulse: 47  Height: 5' 7"  (1.702 m)  Weight: 154 lb 3.2 oz (69.945 kg)  SpO2: 100%   GENERAL: The patient is a well-nourished female, in no acute distress. The vital signs are documented above. CARDIOVASCULAR: There is a regular rate and rhythm. I do not detect carotid bruits. She has palpable pedal pulses bilaterally. PULMONARY: There is good air exchange  bilaterally without wheezing or rales. ABDOMEN: Soft and non-tender with normal pitched bowel sounds.  MUSCULOSKELETAL: There are no major deformities or cyanosis. NEUROLOGIC: No focal weakness or paresthesias are detected. SKIN: she has spider veins and telangiectasias bilaterally. She has some large truncal varicosities along the medial aspect of her left calf.PSYCHIATRIC: The patient has a normal affect.  DATA:  I have independently interpreted her venous duplex scan. On the left side, there is no evidence of DVT. She does have reflux in the left common femoral vein. There is also reflux in the greater saphenous vein. There is no reflux in the short saphenous vein.  On the right side there is no evidence of DVT. There is some deep vein reflux in the common  femoral vein. There is no reflux in the greater saphenous vein on the right or in the short saphenous vein.  MEDICAL ISSUES:  VARICOSE VEINS OF BILATERAL LOWER EXTREMITIES WITH PAIN: this patient has some mild deep vein reflux bilaterally but most significantly severe reflux in her left greater saphenous vein. Diameters of the vein range from 0.52-0.6 cm. We have discussed the importance of intermittent leg elevation. She has been wearing compression stockings for many years. She wears thigh-high stockings with a gradient of 30-40. I've encouraged her to stay as active as possible. I've encouraged her to avoid prolonged sitting and standing. I think she would be a good candidate for laser ablation of left greater saphenous vein and I will set her up for an appointment with Dr. Kellie Simmering or Dr. Donnetta Hutching to discuss this.  Deitra Mayo [facility] [place]: [phone]

## 2014-11-30 ENCOUNTER — Telehealth: Payer: Self-pay

## 2014-11-30 NOTE — Telephone Encounter (Signed)
Pt works at [employer] Pt called office today wanting to schedule her TCS. Pt states that she wants to have her procedure on a Monday after July 18th. Pt was seen in the office by Walden Field on 11/20/2014.    I instructed to pt that she will have to come in for another office visit prior to her procedure. Pt states that having another office visit is ridiculous and that the NP told her that he would put a note in her chart that another office visit wouldn't be needed. Pt states he didn't even need to have the first office visit.   Sent staff message to Dr. Gala Romney

## 2014-12-05 ENCOUNTER — Ambulatory Visit: Payer: 59 | Admitting: Vascular Surgery

## 2014-12-19 NOTE — Telephone Encounter (Signed)
Called pt and LMOM for her to call office back

## 2014-12-19 NOTE — Telephone Encounter (Signed)
-----   Message from Daneil Dolin, MD sent at 12/15/2014  8:43 AM EDT ----- Regarding: RE: Office Visit If I did not respond to this previously, I think we can accommodate her on her request. ----- Message -----    From: Malyn Aytes M [place], LPN    Sent: 8/78/6767   3:25 PM      To: Mickel Fuchs, MD Subject: Office Visit                                   Pt works at [employer]. Pt called office today wanting to schedule her TCS. Pt states that she wants to have her procedure on a Monday after July 18th. Pt was seen in the office by Walden Field on 11/20/2014.    I instructed to pt that she will have to come in for another office visit prior to her procedure. Pt states that having another office visit is ridiculous and that the NP told her that he would put a note in her chart that another office visit wouldn't be needed. Pt states she didn't even need to have the first office visit.   I know what your policy is on having an office visit within 30 days of procedure is.  Pt was addiment about not coming in.   Please advise.

## 2015-01-03 ENCOUNTER — Other Ambulatory Visit: Payer: Self-pay

## 2015-01-03 ENCOUNTER — Telehealth: Payer: Self-pay

## 2015-01-03 DIAGNOSIS — Z86010 Personal history of colonic polyps: Secondary | ICD-10-CM

## 2015-01-03 MED ORDER — PEG-KCL-NACL-NASULF-NA ASC-C 100 G PO SOLR: 1.0000 | Freq: Once | ORAL | Status: AC

## 2015-01-03 NOTE — Telephone Encounter (Signed)
Pt called to set up her TCS. Per RMR it is ok for pt to be scheduled in July. Pt is past her 30 days. RMR is aware.   Pt denies any changes history.    Pt is set for  01/29/2015 @ 7:30 am. Mailed instructions and called in RX to pharmacy.

## 2015-01-08 NOTE — Telephone Encounter (Signed)
See other office note.

## 2015-01-26 ENCOUNTER — Encounter: Payer: Self-pay | Admitting: Vascular Surgery

## 2015-01-29 ENCOUNTER — Encounter (HOSPITAL_COMMUNITY)
Admission: RE | Disposition: A | Discharge status: Home / Self Care | Payer: Self-pay | Source: Ambulatory Visit | Attending: Internal Medicine

## 2015-01-29 ENCOUNTER — Ambulatory Visit (HOSPITAL_COMMUNITY)
Admission: RE | Admit: 2015-01-29 | Discharge: 2015-01-29 | Disposition: A | Discharge status: Home / Self Care | Payer: 59 | Source: Ambulatory Visit | Attending: Internal Medicine | Admitting: Internal Medicine

## 2015-01-29 ENCOUNTER — Encounter (HOSPITAL_COMMUNITY): Payer: Self-pay | Admitting: *Deleted

## 2015-01-29 DIAGNOSIS — Z860100 Personal history of colon polyps, unspecified: Secondary | ICD-10-CM

## 2015-01-29 DIAGNOSIS — Z7722 Contact with and (suspected) exposure to environmental tobacco smoke (acute) (chronic): Secondary | ICD-10-CM | POA: Diagnosis not present

## 2015-01-29 DIAGNOSIS — Z09 Encounter for follow-up examination after completed treatment for conditions other than malignant neoplasm: Secondary | ICD-10-CM | POA: Insufficient documentation

## 2015-01-29 DIAGNOSIS — Z800 Family history of malignant neoplasm of digestive organs: Secondary | ICD-10-CM | POA: Diagnosis not present

## 2015-01-29 HISTORY — DX: Other complications of anesthesia, initial encounter: T88.59XA

## 2015-01-29 HISTORY — PX: COLONOSCOPY: SHX5424

## 2015-01-29 HISTORY — DX: Polyp of colon: K63.5

## 2015-01-29 HISTORY — DX: Adverse effect of unspecified anesthetic, initial encounter: T41.45XA

## 2015-01-29 SURGERY — COLONOSCOPY
Anesthesia: Moderate Sedation

## 2015-01-29 MED ORDER — SODIUM CHLORIDE 0.9 % IJ SOLN
INTRAMUSCULAR | Status: AC
  Filled 2015-01-29: qty 3

## 2015-01-29 MED ORDER — MIDAZOLAM HCL 5 MG/5ML IJ SOLN
INTRAMUSCULAR | Status: AC
  Filled 2015-01-29: qty 10

## 2015-01-29 MED ORDER — SODIUM CHLORIDE 0.9 % IV SOLN
INTRAVENOUS | Status: DC
  Administered 2015-01-29: 07:00:00 via INTRAVENOUS

## 2015-01-29 MED ORDER — ONDANSETRON HCL 4 MG/2ML IJ SOLN
INTRAMUSCULAR | Status: AC
  Filled 2015-01-29: qty 2

## 2015-01-29 MED ORDER — ONDANSETRON HCL 4 MG/2ML IJ SOLN
INTRAMUSCULAR | Status: DC | PRN
  Administered 2015-01-29: 4 mg via INTRAVENOUS

## 2015-01-29 MED ORDER — MEPERIDINE HCL 100 MG/ML IJ SOLN
INTRAMUSCULAR | Status: DC | PRN
  Administered 2015-01-29: 50 mg via INTRAVENOUS
  Administered 2015-01-29: 25 mg via INTRAVENOUS

## 2015-01-29 MED ORDER — STERILE WATER FOR IRRIGATION IR SOLN
Status: DC | PRN
  Administered 2015-01-29: 08:00:00

## 2015-01-29 MED ORDER — MEPERIDINE HCL 100 MG/ML IJ SOLN
INTRAMUSCULAR | Status: DC
Start: 2015-01-29 — End: 2015-01-29
  Filled 2015-01-29: qty 2

## 2015-01-29 MED ORDER — MIDAZOLAM HCL 5 MG/5ML IJ SOLN
INTRAMUSCULAR | Status: DC | PRN
  Administered 2015-01-29: 2 mg via INTRAVENOUS
  Administered 2015-01-29 (×2): 1 mg via INTRAVENOUS

## 2015-01-29 NOTE — Op Note (Signed)
[facility] [address] [place], [address]   COLONOSCOPY PROCEDURE REPORT  PATIENT: Stephine, Langbehn  MR#: [id number] BIRTHDATE: [birth year] , 85  yrs. old GENDER: female ENDOSCOPIST: R.  Garfield Cornea, MD FACP [facility] REFERRED HU:TMLYY Tollie Pizza, M.D. PROCEDURE DATE:  2015-02-04 PROCEDURE:   Colonoscopy, surveillance INDICATIONS:Personal history of colonic polyps; positive family history of colon cancer and polyps. MEDICATIONS: Versed 4 mg IV and Demerol 75 mg IV in divided doses. Zofran 4 mg IV. ASA CLASS:       Class II  CONSENT: The risks, benefits, alternatives and imponderables including but not limited to bleeding, perforation as well as the possibility of a missed lesion have been reviewed.  The potential for biopsy, lesion removal, etc. have also been discussed. Questions have been answered.  All parties agreeable.  Please see the history and physical in the medical record for more information.  DESCRIPTION OF PROCEDURE:   After the risks benefits and alternatives of the procedure were thoroughly explained, informed consent was obtained.  The digital rectal exam revealed no abnormalities of the rectum.   The EC-3890Li ([id number])  endoscope was introduced through the anus and advanced to the cecum, which was identified by both the appendix and ileocecal valve. No adverse events experienced.   The quality of the prep was adequate  The instrument was then slowly withdrawn as the colon was fully examined. Estimated blood loss is zero unless otherwise noted in this procedure report.      COLON FINDINGS: Normal-appearing rectal mucosa.  Normal-appearing colonic mucosa.  Although prep was adequate, there was greasy residue throughout the colon which got on the lens tip and made sharp visualization of the mucosa challenging.  Retroflexion was performed. .  Withdrawal time=9 minutes 0 seconds.  The scope was withdrawn and the procedure  completed. COMPLICATIONS: There were no immediate complications.  ENDOSCOPIC IMPRESSION: Normal appearing rectum and colon  RECOMMENDATIONS: Repeat colonoscopy in 5 years.  eSigned:  R. Garfield Cornea, MD [facility] 02/04/2015 8:07 AM   cc:  CPT CODES: ICD CODES:  The ICD and CPT codes recommended by this software are interpretations from the data that the clinical staff has captured with the software.  The verification of the translation of this report to the ICD and CPT codes and modifiers is the sole responsibility of the health care institution and practicing physician where this report was generated.  [employer] will not be held responsible for the validity of the ICD and CPT codes included on this report.  AMA assumes no liability for data contained or not contained herein. CPT is a Designer, television/film set of the [employer].  PATIENT NAME:  Audrianna, Driskill MR#: [id number]

## 2015-01-29 NOTE — H&P (Signed)
@LOGO @   Primary Care Physician:  Stephens Shire, MD Primary Gastroenterologist:  Dr. Gala Romney  Pre-Procedure History & Physical: HPI:  Sharon Hayes is a 54 y.o. female is here for a surveillance colonoscopy.  Polyp removed in 2009. No GI symptoms. Positive family history of polyps and colon cancer.  Past Medical History  Diagnosis Date  . Complication of anesthesia     Hard to wake up.  . Colon polyps     Past Surgical History  Procedure Laterality Date  . Pelvic laparoscopy    . Colonoscopy  11/24/07    OBS:JGGEZM polyp at 15 cm anal verge otherwise normal    Prior to Admission medications   Medication Sig Start Date End Date Taking? Authorizing Provider  peg 3350 powder (MOVIPREP) 100 G SOLR Take 1 kit (200 g total) by mouth once. 01/03/15 02/02/15 Yes Daneil Dolin, MD    Allergies as of 01/03/2015  . (No Known Allergies)    Family History  Problem Relation Age of Onset  . Colon cancer Mother 62    colon  . Cancer Mother   . Varicose Veins Mother   . Heart disease Father   . Hyperlipidemia Father   . Hypertension Father   . Heart attack Father   . Cancer Maternal Aunt     uterine  . Breast cancer Maternal Aunt   . Colon polyps Sister     History   Social History  . Marital Status: Married    Spouse Name: N/A  . Number of Children: N/A  . Years of Education: N/A   Occupational History  . Not on file.   Social History Main Topics  . Smoking status: Passive Smoke Exposure - Never Smoker  . Smokeless tobacco: Never Used  . Alcohol Use: 5.4 oz/week    2 Shots of liquor, 0 Standard drinks or equivalent, 7 Glasses of wine per week     Comment: 1-2 glasses of wine approx 4 days a week  . Drug Use: No  . Sexual Activity: No   Other Topics Concern  . Not on file   Social History Narrative    Review of Systems: See HPI, otherwise negative ROS  Physical Exam: BP 110/74 mmHg  Pulse 40  Temp(Src) 97.6 F (36.4 C) (Oral)  Resp 15  Ht 5' 7"   (1.702 m)  Wt 151 lb (68.493 kg)  BMI 23.64 kg/m2  SpO2 100%  LMP 12/30/2014 General:   Alert,  Well-developed, well-nourished, pleasant and cooperative in NAD Head:  Normocephalic and atraumatic. Eyes:  Sclera clear, no icterus.   Conjunctiva pink. Ears:  Normal auditory acuity. Nose:  No deformity, discharge,  or lesions. Mouth:  No deformity or lesions, dentition normal. Neck:  Supple; no masses or thyromegaly. Lungs:  Clear throughout to auscultation.   No wheezes, crackles, or rhonchi. No acute distress. Heart:  Regular rate and rhythm; no murmurs, clicks, rubs,  or gallops. Abdomen:  Soft, nontender and nondistended. No masses, hepatosplenomegaly or hernias noted. Normal bowel sounds, without guarding, and without rebound.   Msk:  Symmetrical without gross deformities. Normal posture. Pulses:  Normal pulses noted. Extremities:  Without clubbing or edema.  Impression/Plan: KITZIA CAMUS is now here to undergo a surveillance colonoscopy. Risks, benefits, limitations, imponderables and alternatives regarding colonoscopy have been reviewed with the patient. Questions have been answered. All parties agreeable.     Notice:  This dictation was prepared with Dragon dictation along with smaller phrase technology. Any transcriptional errors that  result from this process are unintentional and may not be corrected upon review.

## 2015-01-29 NOTE — Discharge Instructions (Signed)

## 2015-01-30 ENCOUNTER — Encounter: Payer: Self-pay | Admitting: Vascular Surgery

## 2015-01-30 ENCOUNTER — Ambulatory Visit (INDEPENDENT_AMBULATORY_CARE_PROVIDER_SITE_OTHER): Payer: 59 | Admitting: Vascular Surgery

## 2015-01-30 VITALS — BP 96/66 | HR 66 | Temp 97.1°F | Resp 18 | Ht 66.75 in | Wt 156.0 lb

## 2015-01-30 DIAGNOSIS — I872 Venous insufficiency (chronic) (peripheral): Secondary | ICD-10-CM | POA: Diagnosis not present

## 2015-01-30 NOTE — Progress Notes (Signed)
Referred by:  Stephens Shire, MD [address] [place], [place] [address]  History of Present Illness  Sharon Hayes is a 54 y.o. (1960-09-09) female who presents for follow-up of bilateral varicose veins, left significantly worse than right. The patient reports heaviness and achiness of her legs that is aggravated with prolonged sitting and standing. She says she is able to tolerate her right leg symptoms. She works as a Community education officer. She has been wearing thigh high 30-40 mmHg compression stockings for the past 20-30 years. She has reported continued progression of her symptoms despite wearing compression stockings. She also elevates her legs for relief.   Past Medical History  Diagnosis Date  . Complication of anesthesia     Hard to wake up.  . Colon polyps   . Varicose veins     Past Surgical History  Procedure Laterality Date  . Pelvic laparoscopy    . Colonoscopy  11/24/07    LPF:XTKWIO polyp at 15 cm anal verge otherwise normal  . Colonoscopy N/A 01/29/2015    Procedure: COLONOSCOPY;  Surgeon: Daneil Dolin, MD;  Location: [facility];  Service: Endoscopy;  Laterality: N/A;  0730    History   Social History  . Marital Status: Married    Spouse Name: N/A  . Number of Children: N/A  . Years of Education: N/A   Occupational History  . Not on file.   Social History Main Topics  . Smoking status: Never Smoker   . Smokeless tobacco: Never Used  . Alcohol Use: 5.4 oz/week    7 Glasses of wine, 2 Shots of liquor, 0 Standard drinks or equivalent per week     Comment: 1-2 glasses of wine approx 4 days a week  . Drug Use: No  . Sexual Activity: No   Other Topics Concern  . Not on file   Social History Narrative    Family History  Problem Relation Age of Onset  . Colon cancer Mother 56    colon  . Cancer Mother   . Varicose Veins Mother   . Heart disease Father   . Hyperlipidemia Father   . Hypertension Father   . Heart attack Father    . Cancer Maternal Aunt     uterine  . Breast cancer Maternal Aunt   . Colon polyps Sister     Current Outpatient Prescriptions on File Prior to Visit  Medication Sig Dispense Refill  . peg 3350 powder (MOVIPREP) 100 G SOLR Take 1 kit (200 g total) by mouth once. (Patient not taking: Reported on 01/30/2015) 1 kit 0   No current facility-administered medications on file prior to visit.    No Known Allergies  Physical Examination Filed Vitals:   01/30/15 1558  BP: 96/66  Pulse: 66  Temp: 97.1 F (36.2 C)  TempSrc: Oral  Resp: 18  Height: 5' 6.75" (1.695 m)  Weight: 156 lb (70.761 kg)  SpO2: 99%   Body mass index is 24.63 kg/(m^2).  General: A&O x 3, WDWN female in NAd  Pulmonary: Non labored breathing  Vascular: Palpable pedal pulses bilaterally. Tortuous varicosities of left medial calf, spider telangectasias bilaterally.   Musculoskeletal: Extremities without ischemic changes   Neurologic: CN 2-12 grossly intact   Psychiatric: Judgment intact, Mood & affect appropriate for pt's clinical situation  Medical Decision Making  Sharon Hayes is a 54 y.o. female who presents with: Bilateral lower extremity chronic venous insufficiency with varicosities, left worse  than right.   The patient has failed conservative therapy with 30-40 mmHg thigh high compression stockings, elevation and ibuprofen. The diameters of the left great saphenous vein range from 0.52-0.6 cm. Recommend left great saphenous vein ablation with stab phlebectomy of varicosities. The procedure, risks and benefits were discussed with the patient and she wishes to proceed. Will submit paperwork for insurance approval. Patient to schedule procedure at her conveniece.   Virgina Jock, PA-C [facility] [place] Office: [phone] Pager: [phone]  01/30/2015, 5:07 PM  This patient was seen and examined in conjunction with Dr. Donnetta Hutching   I have examined the patient, reviewed  and agree with above. Clearly has failed conservative treatment. Excellent candidate for ablation of her great saphenous vein and stab phlebectomy of painful tributary  varicosities. Will proceed as soon as possible  Curt Jews, MD 01/30/2015 5:27 PM

## 2015-01-30 NOTE — Progress Notes (Signed)
Problems with Activities of Daily Living Secondary to Leg Pain  1. Sharon Hayes works 8 hour shifts that require prolonged standing and this is very difficult for her due to leg pain.  2. Sharon Hayes has had to decrease the distances she runs for exercise due to leg pain and swelling.       Failure of  Conservative Therapy:  1. Worn 20-30 mm Hg thigh high compression hose >3 months with no relief of symptoms.  2. Frequently elevates legs-no relief of symptoms  3. Taken Ibuprofen 600 Mg TID with no relief of symptoms.

## 2015-02-15 ENCOUNTER — Other Ambulatory Visit: Payer: Self-pay | Admitting: *Deleted

## 2015-02-15 DIAGNOSIS — I83893 Varicose veins of bilateral lower extremities with other complications: Secondary | ICD-10-CM

## 2015-03-16 ENCOUNTER — Other Ambulatory Visit: Payer: Self-pay | Admitting: *Deleted

## 2015-03-16 DIAGNOSIS — I83893 Varicose veins of bilateral lower extremities with other complications: Secondary | ICD-10-CM

## 2015-04-09 ENCOUNTER — Other Ambulatory Visit (HOSPITAL_COMMUNITY): Payer: Self-pay | Admitting: Family Medicine

## 2015-04-09 DIAGNOSIS — Z09 Encounter for follow-up examination after completed treatment for conditions other than malignant neoplasm: Secondary | ICD-10-CM

## 2015-04-12 ENCOUNTER — Other Ambulatory Visit: Payer: 59 | Admitting: Vascular Surgery

## 2015-04-17 ENCOUNTER — Ambulatory Visit (HOSPITAL_COMMUNITY)
Admission: RE | Admit: 2015-04-17 | Discharge: 2015-04-17 | Disposition: A | Discharge status: Home / Self Care | Payer: 59 | Source: Ambulatory Visit | Attending: Family Medicine | Admitting: Family Medicine

## 2015-04-17 DIAGNOSIS — N6489 Other specified disorders of breast: Secondary | ICD-10-CM | POA: Diagnosis not present

## 2015-04-17 DIAGNOSIS — Z09 Encounter for follow-up examination after completed treatment for conditions other than malignant neoplasm: Secondary | ICD-10-CM

## 2015-04-19 ENCOUNTER — Ambulatory Visit: Payer: 59 | Admitting: Vascular Surgery

## 2015-04-19 ENCOUNTER — Encounter (HOSPITAL_COMMUNITY): Payer: 59

## 2015-05-15 ENCOUNTER — Encounter: Payer: Self-pay | Admitting: Vascular Surgery

## 2015-05-17 ENCOUNTER — Ambulatory Visit (INDEPENDENT_AMBULATORY_CARE_PROVIDER_SITE_OTHER): Payer: 59 | Admitting: Vascular Surgery

## 2015-05-17 ENCOUNTER — Encounter: Payer: Self-pay | Admitting: Vascular Surgery

## 2015-05-17 VITALS — BP 101/74 | HR 57 | Temp 97.7°F | Resp 16 | Ht 67.0 in | Wt 152.0 lb

## 2015-05-17 DIAGNOSIS — I83893 Varicose veins of bilateral lower extremities with other complications: Secondary | ICD-10-CM | POA: Diagnosis not present

## 2015-05-17 HISTORY — PX: ENDOVENOUS ABLATION SAPHENOUS VEIN W/ LASER: SUR449

## 2015-05-17 NOTE — Progress Notes (Signed)
     Laser Ablation Procedure    Date: 05/17/2015   Sharon Hayes DOB:[birth year]  Consent signed: Yes    Surgeon:  Dr. Sherren Mocha Imri Lor  Procedure: Laser Ablation: left Greater Saphenous Vein  BP 101/74 mmHg  Pulse 57  Temp(Src) 97.7 F (36.5 C) (Oral)  Resp 16  Ht 5\' 7"  (1.702 m)  Wt 152 lb (68.947 kg)  BMI 23.80 kg/m2  SpO2 98%  Tumescent Anesthesia: 450 cc 0.9% NaCl with 50 cc Lidocaine HCL with 1% Epi and 15 cc 8.4% NaHCO3  Local Anesthesia: 3 cc Lidocaine HCL and NaHCO3 (ratio 2:1)  15 watts continuous mode        Total energy: 2421 JOULES   Total time: 2:41    Stab Phlebectomy: 10-20 Sites: Calf  Left leg  Patient tolerated procedure well    Description of Procedure:  After marking the course of the secondary varicosities, the patient was placed on the operating table in the supine position, and the left leg was prepped and draped in sterile fashion.   Local anesthetic was administered and under ultrasound guidance the saphenous vein was accessed with a micro needle and guide wire; then the mirco puncture sheath was placed.  A guide wire was inserted saphenofemoral junction , followed by a 5 french sheath.  The position of the sheath and then the laser fiber below the junction was confirmed using the ultrasound.  Tumescent anesthesia was administered along the course of the saphenous vein using ultrasound guidance. The patient was placed in Trendelenburg position and protective laser glasses were placed on patient and staff, and the laser was fired at 15 watts continuous mode advancing 1-64mm/second for a total of 2421 joules.   For stab phlebectomies, local anesthetic was administered at the previously marked varicosities, and tumescent anesthesia was administered around the vessels.  Ten to 20 stab wounds were made using the tip of an 11 blade. And using the vein hook, the phlebectomies were performed using a hemostat to avulse the varicosities.  Adequate hemostasis was  achieved.     Steri strips were applied to the stab wounds and ABD pads and thigh high compression stockings were applied.  Ace wrap bandages were applied over the phlebectomy sites and at the top of the saphenofemoral junction. Blood loss was less than 15 cc.  The patient ambulated out of the operating room having tolerated the procedure well.  Ablation from just below the knee to just below saphenofemoral junction. Multiple stab phlebectomy is in the medial calf and pretibial area down onto her ankle. Tolerated well.

## 2015-05-18 ENCOUNTER — Telehealth: Payer: Self-pay | Admitting: *Deleted

## 2015-05-18 ENCOUNTER — Encounter: Payer: Self-pay | Admitting: Vascular Surgery

## 2015-05-18 NOTE — Telephone Encounter (Signed)
    05/18/2015  Time: 9:37 AM   Patient Name: Sharon Hayes  Patient of: T.F. Early  Procedure:Laser Ablation left saphenous vein and stab phlebectomy 10-20 incisions left leg 05-17-2015    Reached patient at home and checked  Her status  Yes    Comments/Actions Taken: [name] states minimal left leg discomfort and no left leg bleeding or oozing.  Reviewed post procedural instructions with her and reminded her of post laser ablation duplex and VV follow up appointment with Dr. Donnetta Hutching on 05-24-2015.       @SIGNATURE @

## 2015-05-21 ENCOUNTER — Encounter: Payer: Self-pay | Admitting: Vascular Surgery

## 2015-05-24 ENCOUNTER — Ambulatory Visit (HOSPITAL_COMMUNITY)
Admission: RE | Admit: 2015-05-24 | Discharge: 2015-05-24 | Disposition: A | Discharge status: Home / Self Care | Payer: 59 | Source: Ambulatory Visit | Attending: Vascular Surgery | Admitting: Vascular Surgery

## 2015-05-24 ENCOUNTER — Encounter: Payer: Self-pay | Admitting: Vascular Surgery

## 2015-05-24 ENCOUNTER — Ambulatory Visit (INDEPENDENT_AMBULATORY_CARE_PROVIDER_SITE_OTHER): Payer: 59 | Admitting: Vascular Surgery

## 2015-05-24 VITALS — BP 86/53 | HR 48 | Temp 98.1°F | Resp 12 | Ht 67.0 in | Wt 160.0 lb

## 2015-05-24 DIAGNOSIS — I83893 Varicose veins of bilateral lower extremities with other complications: Secondary | ICD-10-CM | POA: Diagnosis not present

## 2015-05-24 NOTE — Progress Notes (Signed)
The patient presents today for one-week follow-up of laser ablation of her left great saphenous vein from her proximal calf to just below the saphenofemoral junction. Also had stab phlebectomy of multiple tributary varicosities throughout her medial calf and ankle.  She does have the usual amount of soreness and bruising. She has been compliant with her graduated compression garments.  On physical exam she's had very nice cosmetic result from the standpoint of her phlebectomy. She has minimal bruising and some thickening in the ablation site.  Duplex today was reviewed with the patient. This shows closure of her left great saphenous vein from the distal insertion site of the proximal calf to 7 mm from the saphenofemoral junction. There is no evidence of DVT  Impression and plan: Stable one-week follow-up after ablation of her great saphenous vein and stab phlebectomy. We'll continue her compression garments one additional week. Will see [place] again on an as-needed basis

## 2015-06-27 ENCOUNTER — Emergency Department (HOSPITAL_COMMUNITY)
Admission: EM | Admit: 2015-06-27 | Discharge: 2015-06-27 | Disposition: A | Discharge status: Home / Self Care | Payer: 59 | Attending: Emergency Medicine | Admitting: Emergency Medicine

## 2015-06-27 ENCOUNTER — Encounter (HOSPITAL_COMMUNITY): Payer: Self-pay | Admitting: Emergency Medicine

## 2015-06-27 DIAGNOSIS — W260XXA Contact with knife, initial encounter: Secondary | ICD-10-CM | POA: Diagnosis not present

## 2015-06-27 DIAGNOSIS — S61210A Laceration without foreign body of right index finger without damage to nail, initial encounter: Secondary | ICD-10-CM | POA: Insufficient documentation

## 2015-06-27 DIAGNOSIS — S61219A Laceration without foreign body of unspecified finger without damage to nail, initial encounter: Secondary | ICD-10-CM

## 2015-06-27 DIAGNOSIS — Z8679 Personal history of other diseases of the circulatory system: Secondary | ICD-10-CM | POA: Diagnosis not present

## 2015-06-27 DIAGNOSIS — Y9389 Activity, other specified: Secondary | ICD-10-CM | POA: Diagnosis not present

## 2015-06-27 DIAGNOSIS — Y9289 Other specified places as the place of occurrence of the external cause: Secondary | ICD-10-CM | POA: Insufficient documentation

## 2015-06-27 DIAGNOSIS — Z86010 Personal history of colonic polyps: Secondary | ICD-10-CM | POA: Insufficient documentation

## 2015-06-27 DIAGNOSIS — Y998 Other external cause status: Secondary | ICD-10-CM | POA: Insufficient documentation

## 2015-06-27 DIAGNOSIS — S6991XA Unspecified injury of right wrist, hand and finger(s), initial encounter: Secondary | ICD-10-CM | POA: Diagnosis present

## 2015-06-27 MED ORDER — LIDOCAINE HCL (PF) 1 % IJ SOLN
5.0000 mL | Freq: Once | INTRAMUSCULAR | Status: DC
Start: 2015-06-27 — End: 2015-06-28
  Filled 2015-06-27: qty 5

## 2015-06-27 NOTE — ED Provider Notes (Signed)
CSN: [id number]     Arrival date & time 06/27/15  2236 History   First MD Initiated Contact with Patient 06/27/15 2238     Chief Complaint  Patient presents with  . Finger Injury     (Consider location/radiation/quality/duration/timing/severity/associated sxs/prior Treatment) Patient is a 54 y.o. female presenting with skin laceration. The history is provided by the patient.  Laceration Location:  Hand Hand laceration location:  R hand Depth:  Cutaneous Bleeding: venous   Time since incident:  40 minutes Laceration mechanism:  Knife Pain details:    Quality:  Throbbing   Severity:  Moderate   Progression:  Worsening Foreign body present:  No foreign bodies Relieved by:  Nothing Worsened by:  Movement Tetanus status:  Up to date   Past Medical History  Diagnosis Date  . Complication of anesthesia     Hard to wake up.  . Colon polyps   . Varicose veins    Past Surgical History  Procedure Laterality Date  . Pelvic laparoscopy    . Colonoscopy  11/24/07    [id number] polyp at 15 cm anal verge otherwise normal  . Colonoscopy N/A 01/29/2015    Procedure: COLONOSCOPY;  Surgeon: Daneil Dolin, MD;  Location: [facility];  Service: Endoscopy;  Laterality: N/A;  0730  . Endovenous ablation saphenous vein w/ laser Left 05-17-2015    endovenous laser ablation left greater saphenous vein and stab phlebectomy 10-20 incisions left leg by Curt Jews MD    Family History  Problem Relation Age of Onset  . Colon cancer Mother 23    colon  . Cancer Mother   . Varicose Veins Mother   . Heart disease Father   . Hyperlipidemia Father   . Hypertension Father   . Heart attack Father   . Cancer Maternal Aunt     uterine  . Breast cancer Maternal Aunt   . Colon polyps Sister    Social History  Substance Use Topics  . Smoking status: Never Smoker   . Smokeless tobacco: Never Used  . Alcohol Use: 5.4 oz/week    7 Glasses of wine, 2 Shots of liquor, 0 Standard drinks or  equivalent per week     Comment: 1-2 glasses of wine approx 4 days a week   OB History    Gravida Para Term Preterm AB TAB SAB Ectopic Multiple Living   4 3   1  1   3      Review of Systems  Constitutional: Negative for activity change.       All ROS Neg except as noted in HPI  HENT: Negative for nosebleeds.   Eyes: Negative for photophobia and discharge.  Respiratory: Negative for cough, shortness of breath and wheezing.   Cardiovascular: Negative for chest pain and palpitations.  Gastrointestinal: Negative for abdominal pain and blood in stool.  Genitourinary: Negative for dysuria, frequency and hematuria.  Musculoskeletal: Negative for back pain, arthralgias and neck pain.  Skin: Negative.   Neurological: Negative for dizziness, seizures and speech difficulty.  Psychiatric/Behavioral: Negative for hallucinations and confusion.      Allergies  Review of patient's allergies indicates no known allergies.  Home Medications   Prior to Admission medications   Not on File   BP 125/76 mmHg  Pulse 59  Temp(Src) 97.6 F (36.4 C) (Oral)  Resp 16  Ht 5\' 7"  (1.702 m)  Wt 68.947 kg  BMI 23.80 kg/m2  SpO2 100% Physical Exam  Constitutional: She is oriented to person, place,  and time. She appears well-developed and well-nourished.  Non-toxic appearance.  HENT:  Head: Normocephalic.  Right Ear: Tympanic membrane and external ear normal.  Left Ear: Tympanic membrane and external ear normal.  Eyes: EOM and lids are normal. Pupils are equal, round, and reactive to light.  Neck: Normal range of motion. Neck supple. Carotid bruit is not present.  Cardiovascular: Normal rate, regular rhythm, normal heart sounds, intact distal pulses and normal pulses.   Pulmonary/Chest: Breath sounds normal. No respiratory distress.  Abdominal: Soft. Bowel sounds are normal. There is no tenderness. There is no guarding.  Musculoskeletal: Normal range of motion.  1.6cm laceration of the  lateral-palmar surface of the mid left ring finger. No bone or tendon involvement.  Lymphadenopathy:       Head (right side): No submandibular adenopathy present.       Head (left side): No submandibular adenopathy present.    She has no cervical adenopathy.  Neurological: She is alert and oriented to person, place, and time. She has normal strength. No cranial nerve deficit or sensory deficit.  Skin: Skin is warm and dry.  Psychiatric: She has a normal mood and affect. Her speech is normal.  Nursing note and vitals reviewed.   ED Course  .Marland KitchenLaceration Repair Date/Time: 06/28/2015 10:32 AM Performed by: Lily Kocher Authorized by: Lily Kocher Consent: Verbal consent obtained. Risks and benefits: risks, benefits and alternatives were discussed Consent given by: patient Patient understanding: patient states understanding of the procedure being performed Patient identity confirmed: arm band Time out: Immediately prior to procedure a "time out" was called to verify the correct patient, procedure, equipment, support staff and site/side marked as required. Body area: upper extremity Location details: right index finger Laceration length: 1.6 cm Foreign bodies: no foreign bodies Tendon involvement: none Nerve involvement: none Local anesthetic: lidocaine 1% without epinephrine Patient sedated: no Preparation: Patient was prepped and draped in the usual sterile fashion. Irrigation solution: saline Amount of cleaning: standard Skin closure: 4-0 nylon Number of sutures: 4 Technique: simple Approximation: close Approximation difficulty: simple Dressing: gauze roll Patient tolerance: Patient tolerated the procedure well with no immediate complications   (including critical care time) Labs Review Labs Reviewed - No data to display  Imaging Review No results found. I have personally reviewed and evaluated these images and lab results as part of my medical decision-making.   EKG  Interpretation None      MDM  Pt sustained laceration of the right ring finger, repaired with 4-0 nylon. No bone or tendon involvement. Pt to have sutures removed in 7 days.   Final diagnoses:  None    *I have reviewed nursing notes, vital signs, and all appropriate lab and imaging results for this patient.[address], PA-C 06/28/15 1053  Noemi Chapel, MD 07/01/15 [phone]

## 2015-06-27 NOTE — ED Notes (Signed)
Pt has laceration to the rt ring finger from knife.

## 2015-06-27 NOTE — Discharge Instructions (Signed)
Please keep your wound clean and dry. Please have your sutures removed in 7 or 8 days. Please see your primary physician, or return to the emergency department if any pus like drainage, red streaking, fever, or signs of advancing infection. Stitches, Staples, or Adhesive Wound Closure Health care providers use stitches (sutures), staples, and certain glue (skin adhesives) to hold skin together while it heals (wound closure). You may need this treatment after you have surgery or if you cut your skin accidentally. These methods help your skin to heal more quickly and make it less likely that you will have a scar. A wound may take several months to heal completely. The type of wound you have determines when your wound gets closed. In most cases, the wound is closed as soon as possible (primary skin closure). Sometimes, closure is delayed so the wound can be cleaned and allowed to heal naturally. This reduces the chance of infection. Delayed closure may be needed if your wound:  Is caused by a bite.  Happened more than 6 hours ago.  Involves loss of skin or the tissues under the skin.  Has dirt or debris in it that cannot be removed.  Is infected. WHAT ARE THE DIFFERENT KINDS OF WOUND CLOSURES? There are many options for wound closure. The one that your health care provider uses depends on how deep and how large your wound is. Adhesive Glue To use this type of glue to close a wound, your health care provider holds the edges of the wound together and paints the glue on the surface of your skin. You may need more than one layer of glue. Then the wound may be covered with a light bandage (dressing). This type of skin closure may be used for small wounds that are not deep (superficial). Using glue for wound closure is less painful than other methods. It does not require a medicine that numbs the area (local anesthetic). This method also leaves nothing to be removed. Adhesive glue is often used for children  and on facial wounds. Adhesive glue cannot be used for wounds that are deep, uneven, or bleeding. It is not used inside of a wound.  Adhesive Strips These strips are made of sticky (adhesive), porous paper. They are applied across your skin edges like a regular adhesive bandage. You leave them on until they fall off. Adhesive strips may be used to close very superficial wounds. They may also be used along with sutures to improve the closure of your skin edges.  Sutures Sutures are the oldest method of wound closure. Sutures can be made from natural substances, such as silk, or from synthetic materials, such as nylon and steel. They can be made from a material that your body can break down as your wound heals (absorbable), or they can be made from a material that needs to be removed from your skin (nonabsorbable). They come in many different strengths and sizes. Your health care provider attaches the sutures to a steel needle on one end. Sutures can be passed through your skin, or through the tissues beneath your skin. Then they are tied and cut. Your skin edges may be closed in one continuous stitch or in separate stitches. Sutures are strong and can be used for all kinds of wounds. Absorbable sutures may be used to close tissues under the skin. The disadvantage of sutures is that they may cause skin reactions that lead to infection. Nonabsorbable sutures need to be removed. Staples When surgical staples are used  to close a wound, the edges of your skin on both sides of the wound are brought close together. A staple is placed across the wound, and an instrument secures the edges together. Staples are often used to close surgical cuts (incisions). Staples are faster to use than sutures, and they cause less skin reaction. Staples need to be removed using a tool that bends the staples away from your skin. HOW DO I CARE FOR MY WOUND CLOSURE?  Take medicines only as directed by your health care  provider.  If you were prescribed an antibiotic medicine for your wound, finish it all even if you start to feel better.  Use ointments or creams only as directed by your health care provider.  Wash your hands with soap and water before and after touching your wound.  Do not soak your wound in water. Do not take baths, swim, or use a hot tub until your health care provider approves.  Ask your health care provider when you can start showering. Cover your wound if directed by your health care provider.  Do not take out your own sutures or staples.  Do not pick at your wound. Picking can cause an infection.  Keep all follow-up visits as directed by your health care provider. This is important. HOW LONG WILL I HAVE MY WOUND CLOSURE?  Leave adhesive glue on your skin until the glue peels away.  Leave adhesive strips on your skin until the strips fall off.  Absorbable sutures will dissolve within several days.  Nonabsorbable sutures and staples must be removed. The location of the wound will determine how long they stay in. This can range from several days to a couple of weeks. WHEN SHOULD I SEEK HELP FOR MY WOUND CLOSURE? Contact your health care provider if:  You have a fever.  You have chills.  You have drainage, redness, swelling, or pain at your wound.  There is a bad smell coming from your wound.  The skin edges of your wound start to separate after your sutures have been removed.  Your wound becomes thick, raised, and darker in color after your sutures come out (scarring).   This information is not intended to replace advice given to you by your health care provider. Make sure you discuss any questions you have with your health care provider.   Document Released: 03/25/2001 Document Revised: 07/21/2014 Document Reviewed: 12/07/2013 Elsevier Interactive Patient Education [employer].

## 2016-01-31 ENCOUNTER — Encounter: Payer: Self-pay | Admitting: Gynecology

## 2016-01-31 ENCOUNTER — Ambulatory Visit (INDEPENDENT_AMBULATORY_CARE_PROVIDER_SITE_OTHER): Payer: 59 | Admitting: Gynecology

## 2016-01-31 VITALS — BP 110/68 | Ht 66.0 in | Wt 159.0 lb

## 2016-01-31 DIAGNOSIS — Z01419 Encounter for gynecological examination (general) (routine) without abnormal findings: Secondary | ICD-10-CM | POA: Diagnosis not present

## 2016-01-31 DIAGNOSIS — N952 Postmenopausal atrophic vaginitis: Secondary | ICD-10-CM | POA: Diagnosis not present

## 2016-01-31 DIAGNOSIS — Z780 Asymptomatic menopausal state: Secondary | ICD-10-CM

## 2016-01-31 MED ORDER — ESTRADIOL 10 MCG VA TABS: 1.0000 | ORAL_TABLET | VAGINAL | Status: DC

## 2016-01-31 NOTE — Progress Notes (Signed)
Sharon Hayes [birth year] [id number]   History:    55 y.o.  for annual gyn exam who has not been seen the office for 3 years. She stated that her last menstrual cycle was in June 2016 she has very little vasomotor symptoms and is having vaginal dryness and some discomfort at time of intercourse. Her PCP is Dr. Tollie Pizza who is been doing her blood work. She had a colonoscopy last year benign polyps were removed and she's on a 5 year recall. 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  Past medical history,surgical history, family history and social history were all reviewed and documented in the EPIC chart.  Gynecologic History No LMP recorded. Patient is not currently having periods (Reason: Perimenopausal). Contraception: post menopausal status Last Pap: 2014. Results were: normal Last mammogram: 2016. Results were: normal  Obstetric History OB History  Gravida Para Term Preterm AB SAB TAB Ectopic Multiple Living  4 3   1 1    3     # Outcome Date GA Lbr Len/2nd Weight Sex Delivery Anes PTL Lv  4 SAB           3 Para           2 Para           1 Para                ROS: A ROS was performed and pertinent positives and negatives are included in the history.  GENERAL: No fevers or chills. HEENT: No change in vision, no earache, sore throat or sinus congestion. NECK: No pain or stiffness. CARDIOVASCULAR: No chest pain or pressure. No palpitations. PULMONARY: No shortness of breath, cough or wheeze. GASTROINTESTINAL: No abdominal pain, nausea, vomiting or diarrhea, melena or bright red blood per rectum. GENITOURINARY: No urinary frequency, urgency, hesitancy or dysuria. MUSCULOSKELETAL: No joint or muscle pain, no back pain, no recent trauma. DERMATOLOGIC: No rash, no itching, no lesions. ENDOCRINE: No polyuria, polydipsia, no heat or cold intolerance. No  recent change in weight. HEMATOLOGICAL: No anemia or easy bruising or bleeding. NEUROLOGIC: No headache, seizures, numbness, tingling or weakness. PSYCHIATRIC: No depression, no loss of interest in normal activity or change in sleep pattern.     Exam: chaperone present  BP 110/68 mmHg  Ht 5\' 6"  (1.676 m)  Wt 159 lb (72.122 kg)  BMI 25.68 kg/m2  Body mass index is 25.68 kg/(m^2).  General appearance : Well developed well nourished female. No acute distress HEENT: Eyes: no retinal hemorrhage or exudates,  Neck supple, trachea midline, no carotid bruits, no thyroidmegaly Lungs: Clear to auscultation, no rhonchi or wheezes, or rib retractions  Heart: Regular rate and rhythm, no murmurs or gallops Breast:Examined in sitting and supine position were symmetrical in appearance, no palpable masses or tenderness,  no skin retraction, no nipple inversion, no nipple discharge, no skin discoloration, no axillary or supraclavicular lymphadenopathy Abdomen: no palpable masses or tenderness, no rebound or guarding Extremities: no edema or skin discoloration or tenderness  Pelvic:  Bartholin, Urethra, Skene Glands: Within normal limits             Vagina: No gross lesions or discharge, atrophic changes  Cervix: No gross lesions or discharge  Uterus  anteverted, normal size, shape and consistency, non-tender and mobile  Adnexa  Without masses or tenderness  Anus and perineum  normal   Rectovaginal  normal sphincter tone without palpated masses or tenderness             Hemoccult cards will be provided     Assessment/Plan:  55 y.o. female for annual exam menopausal with vaginal atrophy some dyspareunia. We discussed prescribing Vagifem 10 g to apply intravaginally twice a week. Risk benefits and pros and cons were discussed and literature information was provided. Patient will be scheduled for bone density study here in the office the next several weeks she will be provided also with fecal Hemoccult  cards to bring to the office when she returns for that office visit. We discussed the importance of calcium vitamin D and weightbearing exercises for osteoporosis prevention. Pap smear was done today.   Terrance Mass MD, 5:28 PM 01/31/2016

## 2016-01-31 NOTE — Patient Instructions (Addendum)
Bone Densitometry Bone densitometry is an imaging test that uses a special X-ray to measure the amount of calcium and other minerals in your bones (bone density). This test is also known as a bone mineral density test or dual-energy X-ray absorptiometry (DXA). The test can measure bone density at your hip and your spine. It is similar to having a regular X-ray. You may have this test to:  Diagnose a condition that causes weak or thin bones (osteoporosis).  Predict your risk of a broken bone (fracture).  Determine how well osteoporosis treatment is working. LET [facility] CARE PROVIDER KNOW ABOUT:  Any allergies you have.  All medicines you are taking, including vitamins, herbs, eye drops, creams, and over-the-counter medicines.  Previous problems you or members of your family have had with the use of anesthetics.  Any blood disorders you have.  Previous surgeries you have had.  Medical conditions you have.  Possibility of pregnancy.  Any other medical test you had within the previous 14 days that used contrast material. RISKS AND COMPLICATIONS Generally, this is a safe procedure. However, problems can occur and may include the following:  This test exposes you to a very small amount of radiation.  The risks of radiation exposure may be greater to unborn children. BEFORE THE PROCEDURE  Do not take any calcium supplements for 24 hours before having the test. You can otherwise eat and drink what you usually do.  Take off all metal jewelry, eyeglasses, dental appliances, and any other metal objects. PROCEDURE  You may lie on an exam table. There will be an X-ray generator below you and an imaging device above you.  Other devices, such as boxes or braces, may be used to position your body properly for the scan.  You will need to lie still while the machine slowly scans your body.  The images will show up on a computer monitor. AFTER THE PROCEDURE You may need more testing  at a later time.   This information is not intended to replace advice given to you by your health care provider. Make sure you discuss any questions you have with your health care provider.   Document Released: 07/22/2004 Document Revised: 07/21/2014 Document Reviewed: 12/08/2013 Elsevier Interactive Patient Education 2016 Elsevier Inc. Estradiol vaginal tablets What is this medicine? ESTRADIOL (es tra DYE ole) vaginal tablet is used to help relieve symptoms of vaginal irritation and dryness that occurs in some women during menopause. This medicine may be used for other purposes; ask your health care provider or pharmacist if you have questions. What should I tell my health care provider before I take this medicine? They need to know if you have any of these conditions: -abnormal vaginal bleeding -blood vessel disease or blood clots -breast, cervical, endometrial, ovarian, liver, or uterine cancer -dementia -diabetes -gallbladder disease -heart disease or recent heart attack -high blood pressure -high cholesterol -high level of calcium in the blood -hysterectomy -kidney disease -liver disease -migraine headaches -protein C deficiency -protein S deficiency -stroke -systemic lupus erythematosus (SLE) -tobacco smoker -an unusual or allergic reaction to estrogens, other hormones, medicines, foods, dyes, or preservatives -pregnant or trying to get pregnant -breast-feeding How should I use this medicine? This medicine is only for use in the vagina. Do not take by mouth. Wash and dry your hands before and after use. Read package directions carefully. Unwrap the applicator package. Be sure to use a new applicator for each dose. Use at the same time each day. If the tablet  has fallen out of the applicator, but is still in the package, carefully place it back into the applicator. If the tablet has fallen out of the package, that applicator should be thrown out and you should use a new  applicator containing a new tablet. Lie on your back, part and bend your knees. Gently insert the applicator as far as comfortably possible into the vagina. Then, gently press the plunger until the plunger is fully depressed. This will release the tablet into the vagina. Gently remove the applicator. Throw away the applicator after use. Do not use your medicine more often than directed. Do not stop using except on the advice of your doctor or health care professional. Talk to your pediatrician regarding the use of this medicine in children. This medicine is not approved for use in children. A patient package insert for the product will be given with each prescription and refill. Read this sheet carefully each time. The sheet may change frequently. Overdosage: If you think you have taken too much of this medicine contact a poison control center or emergency room at once. NOTE: This medicine is only for you. Do not share this medicine with others. What if I miss a dose? If you miss a dose, take it as soon as you can. If it is almost time for your next dose, take only that dose. Do not take double or extra doses. What may interact with this medicine? Do not take this medicine with any of the following medications: -aromatase inhibitors like aminoglutethimide, anastrozole, exemestane, letrozole, testolactone This medicine may also interact with the following medications: -antibiotics used to treat tuberculosis like rifabutin, rifampin and rifapentene -raloxifene or tamoxifen -warfarin This list may not describe all possible interactions. Give your health care provider a list of all the medicines, herbs, non-prescription drugs, or dietary supplements you use. Also tell them if you smoke, drink alcohol, or use illegal drugs. Some items may interact with your medicine. What should I watch for while using this medicine? Visit your health care professional for regular checks on your progress. You will need a  regular breast and pelvic exam. You should also discuss the need for regular mammograms with your health care professional, and follow his or her guidelines. This medicine can make your body retain fluid, making your fingers, hands, or ankles swell. Your blood pressure can go up. Contact your doctor or health care professional if you feel you are retaining fluid. If you have any reason to think you are pregnant; stop taking this medicine at once and contact your doctor or health care professional. Tobacco smoking increases the risk of getting a blood clot or having a stroke, especially if you are more than 55 years old. You are strongly advised not to smoke. If you wear contact lenses and notice visual changes, or if the lenses begin to feel uncomfortable, consult your eye care specialist. If you are going to have elective surgery, you may need to stop taking this medicine beforehand. Consult your health care professional for advice prior to scheduling the surgery. What side effects may I notice from receiving this medicine? Side effects that you should report to your doctor or health care professional as soon as possible: -allergic reactions like skin rash, itching or hives, swelling of the face, lips, or tongue -breast tissue changes or discharge -changes in vision -chest pain -confusion, trouble speaking or understanding -dark urine -general ill feeling or flu-like symptoms -light-colored stools -nausea, vomiting -pain, swelling, warmth in the leg -  right upper belly pain -severe headaches -shortness of breath -sudden numbness or weakness of the face, arm or leg -trouble walking, dizziness, loss of balance or coordination -unusual vaginal bleeding -yellowing of the eyes or skin Side effects that usually do not require medical attention (report to your doctor or health care professional if they continue or are bothersome): -hair loss -increased hunger or thirst -increased  urination -symptoms of vaginal infection like itching, irritation or unusual discharge -unusually weak or tired This list may not describe all possible side effects. Call your doctor for medical advice about side effects. You may report side effects to FDA at 1-800-FDA-1088. Where should I keep my medicine? Keep out of the reach of children. Store at room temperature between 15 and 30 degrees C (59 and 86 degrees F). Throw away any unused medicine after the expiration date. NOTE: This sheet is a summary. It may not cover all possible information. If you have questions about this medicine, talk to your doctor, pharmacist, or health care provider.    2016, Elsevier/Gold Standard. (2014-06-14 09:22:51)

## 2016-02-01 NOTE — Addendum Note (Signed)
Addended by: Thurnell Garbe A on: 02/01/2016 08:59 AM   Modules accepted: Orders, SmartSet

## 2016-02-04 LAB — PAP IG W/ RFLX HPV ASCU

## 2016-04-17 ENCOUNTER — Ambulatory Visit (INDEPENDENT_AMBULATORY_CARE_PROVIDER_SITE_OTHER): Payer: 59

## 2016-04-17 ENCOUNTER — Other Ambulatory Visit: Payer: Self-pay | Admitting: Gynecology

## 2016-04-17 DIAGNOSIS — M8580 Other specified disorders of bone density and structure, unspecified site: Secondary | ICD-10-CM | POA: Diagnosis not present

## 2016-04-17 DIAGNOSIS — Z780 Asymptomatic menopausal state: Secondary | ICD-10-CM

## 2016-04-17 DIAGNOSIS — Z13820 Encounter for screening for osteoporosis: Secondary | ICD-10-CM | POA: Diagnosis not present

## 2016-11-26 ENCOUNTER — Encounter: Payer: Self-pay | Admitting: Gynecology

## 2017-01-08 ENCOUNTER — Other Ambulatory Visit (HOSPITAL_COMMUNITY): Payer: Self-pay | Admitting: Family Medicine

## 2017-01-08 DIAGNOSIS — Z1231 Encounter for screening mammogram for malignant neoplasm of breast: Secondary | ICD-10-CM

## 2017-01-21 ENCOUNTER — Ambulatory Visit (HOSPITAL_COMMUNITY)
Admission: RE | Admit: 2017-01-21 | Discharge: 2017-01-21 | Disposition: A | Discharge status: Home / Self Care | Payer: 59 | Source: Ambulatory Visit | Attending: Family Medicine | Admitting: Family Medicine

## 2017-01-21 DIAGNOSIS — Z1231 Encounter for screening mammogram for malignant neoplasm of breast: Secondary | ICD-10-CM | POA: Diagnosis present

## 2017-01-29 ENCOUNTER — Telehealth: Payer: 59 | Admitting: Family

## 2017-01-29 DIAGNOSIS — L089 Local infection of the skin and subcutaneous tissue, unspecified: Secondary | ICD-10-CM

## 2017-01-29 MED ORDER — CEPHALEXIN 500 MG PO CAPS: 500.0000 mg | ORAL_CAPSULE | Freq: Three times a day (TID) | ORAL | 0 refills | Status: DC

## 2017-01-29 NOTE — Progress Notes (Signed)
Thank you for the details you put in the comment boxes. Those details really help [place] take better care of you. It is likely a simple soft tissue infection which can be staph, but unlikely to be MRSA. We will treat as such. If it worsens over the next few days, it would be ideal to be seen face to face.    E Visit for Rash  We are sorry that you are not feeling well. Here is how we plan to help!  Based upon what you have shared with me it looks like you have a bacterial follicultits.  Folliculitis is inflammation of the hair follicles that can be caused by a superficial infection of the skin and is treated with an antibiotic. I have prescribed:  Keflex 500mg  by mouth 3 times per day times 7 days HOME CARE:   Take cool showers and avoid direct sunlight.  Apply cool compress or wet dressings.  Take a bath in an oatmeal bath.  Sprinkle content of one Aveeno packet under running faucet with comfortably warm water.  Bathe for 15-20 minutes, 1-2 times daily.  Pat dry with a towel. Do not rub the rash.  Use hydrocortisone cream.  Take an antihistamine like Benadryl for widespread rashes that itch.  The adult dose of Benadryl is 25-50 mg by mouth 4 times daily.  Caution:  This type of medication may cause sleepiness.  Do not drink alcohol, drive, or operate dangerous machinery while taking antihistamines.  Do not take these medications if you have prostate enlargement.  Read package instructions thoroughly on all medications that you take.  GET HELP RIGHT AWAY IF:   Symptoms don't go away after treatment.  Severe itching that persists.  If you rash spreads or swells.  If you rash begins to smell.  If it blisters and opens or develops a yellow-brown crust.  You develop a fever.  You have a sore throat.  You become short of breath.  MAKE SURE YOU:  Understand these instructions. Will watch your condition. Will get help right away if you are not doing well or get worse.  Thank you  for choosing an e-visit. Your e-visit answers were reviewed by a board certified advanced clinical practitioner to complete your personal care plan. Depending upon the condition, your plan could have included both over the counter or prescription medications. Please review your pharmacy choice. Be sure that the pharmacy you have chosen is open so that you can pick up your prescription now.  If there is a problem you may message your provider in [place] to have the prescription routed to another pharmacy. Your safety is important to [place]. If you have drug allergies check your prescription carefully.  For the next 24 hours, you can use MyChart to ask questions about today's visit, request a non-urgent call back, or ask for a work or school excuse from your e-visit provider. You will get an email in the next two days asking about your experience. I hope that your e-visit has been valuable and will speed your recovery.

## 2017-02-09 ENCOUNTER — Telehealth: Payer: 59 | Admitting: Family

## 2017-02-09 DIAGNOSIS — L237 Allergic contact dermatitis due to plants, except food: Secondary | ICD-10-CM

## 2017-02-09 MED ORDER — PREDNISONE 10 MG (21) PO TBPK: ORAL_TABLET | ORAL | 0 refills | Status: DC

## 2017-02-09 NOTE — Progress Notes (Signed)
E Visit for Rash  We are sorry that you are not feeling well. Here is how we plan to help!  Based on what you shared with me it looks like you have contact dermatitis.  Contact dermatitis is a skin rash caused by something that touches the skin and causes irritation or inflammation.  Your skin may be red, swollen, dry, cracked, and itch.  The rash should go away in a few days but can last a few weeks.  If you get a rash, it's important to figure out what caused it so the irritant can be avoided in the future.  Sterapred 10 mg dose pak    HOME CARE:   Take cool showers and avoid direct sunlight.  Apply cool compress or wet dressings.  Take a bath in an oatmeal bath.  Sprinkle content of one Aveeno packet under running faucet with comfortably warm water.  Bathe for 15-20 minutes, 1-2 times daily.  Pat dry with a towel. Do not rub the rash.  Use hydrocortisone cream.  Take an antihistamine like Benadryl for widespread rashes that itch.  The adult dose of Benadryl is 25-50 mg by mouth 4 times daily.  Caution:  This type of medication may cause sleepiness.  Do not drink alcohol, drive, or operate dangerous machinery while taking antihistamines.  Do not take these medications if you have prostate enlargement.  Read package instructions thoroughly on all medications that you take.  GET HELP RIGHT AWAY IF:   Symptoms don't go away after treatment.  Severe itching that persists.  If you rash spreads or swells.  If you rash begins to smell.  If it blisters and opens or develops a yellow-brown crust.  You develop a fever.  You have a sore throat.  You become short of breath.  MAKE SURE YOU:  Understand these instructions. Will watch your condition. Will get help right away if you are not doing well or get worse.  Thank you for choosing an e-visit. Your e-visit answers were reviewed by a board certified advanced clinical practitioner to complete your personal care plan.  Depending upon the condition, your plan could have included both over the counter or prescription medications. Please review your pharmacy choice. Be sure that the pharmacy you have chosen is open so that you can pick up your prescription now.  If there is a problem you may message your provider in [place] to have the prescription routed to another pharmacy. Your safety is important to [place]. If you have drug allergies check your prescription carefully.  For the next 24 hours, you can use MyChart to ask questions about today's visit, request a non-urgent call back, or ask for a work or school excuse from your e-visit provider. You will get an email in the next two days asking about your experience. I hope that your e-visit has been valuable and will speed your recovery.

## 2017-08-04 ENCOUNTER — Telehealth: Payer: 59 | Admitting: Family

## 2017-08-04 DIAGNOSIS — B9689 Other specified bacterial agents as the cause of diseases classified elsewhere: Secondary | ICD-10-CM

## 2017-08-04 DIAGNOSIS — J019 Acute sinusitis, unspecified: Secondary | ICD-10-CM

## 2017-08-04 MED ORDER — AMOXICILLIN-POT CLAVULANATE 875-125 MG PO TABS: 1.0000 | ORAL_TABLET | Freq: Two times a day (BID) | ORAL | 0 refills | Status: DC

## 2017-08-04 NOTE — Progress Notes (Signed)

## 2018-02-02 ENCOUNTER — Other Ambulatory Visit (HOSPITAL_COMMUNITY): Payer: Self-pay | Admitting: Family Medicine

## 2018-02-02 DIAGNOSIS — Z1231 Encounter for screening mammogram for malignant neoplasm of breast: Secondary | ICD-10-CM

## 2018-02-08 ENCOUNTER — Ambulatory Visit (HOSPITAL_COMMUNITY)
Admission: RE | Admit: 2018-02-08 | Discharge: 2018-02-08 | Disposition: A | Discharge status: Home / Self Care | Payer: 59 | Source: Ambulatory Visit | Attending: Family Medicine | Admitting: Family Medicine

## 2018-02-08 ENCOUNTER — Encounter (HOSPITAL_COMMUNITY): Payer: Self-pay

## 2018-02-08 DIAGNOSIS — Z1231 Encounter for screening mammogram for malignant neoplasm of breast: Secondary | ICD-10-CM | POA: Diagnosis present

## 2018-05-29 ENCOUNTER — Telehealth: Payer: 59 | Admitting: Family

## 2018-05-29 DIAGNOSIS — N390 Urinary tract infection, site not specified: Secondary | ICD-10-CM

## 2018-05-29 MED ORDER — CEPHALEXIN 500 MG PO CAPS: 500.0000 mg | ORAL_CAPSULE | Freq: Two times a day (BID) | ORAL | 0 refills | Status: DC

## 2018-05-29 NOTE — Progress Notes (Signed)
Thank you for the details you included in the comment boxes. Those details are very helpful in determining the best course of treatment for you and help [place] to provide the best care. Just FYI, the cranberry juice cannot help unless you consume very large amounts (likely over 1-2 gallons), which is not healthy. That myth came from research about D-Mannose which is a sugar in cranberries that prevents certain bacteria from being able to grab onto the bladder wall and infect someone worse. So, there was some truth to it, but it won't really help you. See plan below.  We are sorry that you are not feeling well.  Here is how we plan to help!  Based on what you shared with me it looks like you most likely have a simple urinary tract infection.  A UTI (Urinary Tract Infection) is a bacterial infection of the bladder.  Most cases of urinary tract infections are simple to treat but a key part of your care is to encourage you to drink plenty of fluids and watch your symptoms carefully.  I have prescribed Keflex 500 mg twice a day for 7 days.  Your symptoms should gradually improve. Call us if the burning in your urine worsens, you develop worsening fever, back pain or pelvic pain or if your symptoms do not resolve after completing the antibiotic.  Urinary tract infections can be prevented by drinking plenty of water to keep your body hydrated.  Also be sure when you wipe, wipe from front to back and don't hold it in!  If possible, empty your bladder every 4 hours.  Your e-visit answers were reviewed by a board certified advanced clinical practitioner to complete your personal care plan.  Depending on the condition, your plan could have included both over the counter or prescription medications.  If there is a problem please reply  once you have received a response from your provider.  Your safety is important to [place].  If you have drug allergies check your prescription carefully.    You can use MyChart to ask  questions about today's visit, request a non-urgent call back, or ask for a work or school excuse for 24 hours related to this e-Visit. If it has been greater than 24 hours you will need to follow up with your provider, or enter a new e-Visit to address those concerns.   You will get an e-mail in the next two days asking about your experience.  I hope that your e-visit has been valuable and will speed your recovery. Thank you for using e-visits.

## 2018-11-09 ENCOUNTER — Emergency Department (HOSPITAL_COMMUNITY): Payer: 59

## 2018-11-09 ENCOUNTER — Other Ambulatory Visit: Payer: Self-pay

## 2018-11-09 ENCOUNTER — Emergency Department (HOSPITAL_COMMUNITY)
Admission: EM | Admit: 2018-11-09 | Discharge: 2018-11-09 | Disposition: A | Discharge status: Home / Self Care | Payer: 59 | Source: Home / Self Care | Attending: Emergency Medicine | Admitting: Emergency Medicine

## 2018-11-09 ENCOUNTER — Encounter (HOSPITAL_COMMUNITY): Admission: AD | Disposition: A | Discharge status: Home / Self Care | Payer: Self-pay | Attending: Orthopedic Surgery

## 2018-11-09 ENCOUNTER — Encounter (HOSPITAL_COMMUNITY): Payer: Self-pay | Admitting: *Deleted

## 2018-11-09 ENCOUNTER — Encounter (HOSPITAL_COMMUNITY)
Admission: EM | Disposition: A | Discharge status: Home / Self Care | Payer: Self-pay | Source: Home / Self Care | Attending: Emergency Medicine

## 2018-11-09 ENCOUNTER — Encounter (HOSPITAL_COMMUNITY): Payer: Self-pay

## 2018-11-09 ENCOUNTER — Inpatient Hospital Stay (HOSPITAL_COMMUNITY): Payer: 59 | Admitting: Anesthesiology

## 2018-11-09 ENCOUNTER — Ambulatory Visit (HOSPITAL_COMMUNITY)
Admission: RE | Admit: 2018-11-09 | Discharge: 2018-11-09 | Disposition: A | Discharge status: Home / Self Care | Payer: 59 | Source: Other Acute Inpatient Hospital | Attending: Orthopedic Surgery | Admitting: Orthopedic Surgery

## 2018-11-09 DIAGNOSIS — Y999 Unspecified external cause status: Secondary | ICD-10-CM | POA: Insufficient documentation

## 2018-11-09 DIAGNOSIS — Y9301 Activity, walking, marching and hiking: Secondary | ICD-10-CM | POA: Insufficient documentation

## 2018-11-09 DIAGNOSIS — W010XXA Fall on same level from slipping, tripping and stumbling without subsequent striking against object, initial encounter: Secondary | ICD-10-CM

## 2018-11-09 DIAGNOSIS — Y9302 Activity, running: Secondary | ICD-10-CM | POA: Diagnosis not present

## 2018-11-09 DIAGNOSIS — Y929 Unspecified place or not applicable: Secondary | ICD-10-CM

## 2018-11-09 DIAGNOSIS — S52572A Other intraarticular fracture of lower end of left radius, initial encounter for closed fracture: Secondary | ICD-10-CM | POA: Diagnosis not present

## 2018-11-09 DIAGNOSIS — S52502A Unspecified fracture of the lower end of left radius, initial encounter for closed fracture: Secondary | ICD-10-CM | POA: Insufficient documentation

## 2018-11-09 DIAGNOSIS — W19XXXA Unspecified fall, initial encounter: Secondary | ICD-10-CM

## 2018-11-09 HISTORY — PX: ORIF WRIST FRACTURE: SHX2133

## 2018-11-09 SURGERY — OPEN REDUCTION INTERNAL FIXATION (ORIF) WRIST FRACTURE
Anesthesia: General | Site: Wrist | Laterality: Left

## 2018-11-09 SURGERY — OPEN REDUCTION INTERNAL FIXATION (ORIF) WRIST FRACTURE
Anesthesia: Choice | Laterality: Left

## 2018-11-09 MED ORDER — CEPHALEXIN 500 MG PO CAPS: 500.0000 mg | ORAL_CAPSULE | Freq: Four times a day (QID) | ORAL | 0 refills | Status: AC

## 2018-11-09 MED ORDER — FENTANYL CITRATE (PF) 100 MCG/2ML IJ SOLN
100.0000 ug | Freq: Once | INTRAMUSCULAR | Status: AC
  Administered 2018-11-09: 100 ug via INTRAVENOUS

## 2018-11-09 MED ORDER — FENTANYL CITRATE (PF) 250 MCG/5ML IJ SOLN
INTRAMUSCULAR | Status: AC
  Filled 2018-11-09: qty 5

## 2018-11-09 MED ORDER — MEPERIDINE HCL 50 MG/ML IJ SOLN: 6.2500 mg | INTRAMUSCULAR | Status: DC | PRN

## 2018-11-09 MED ORDER — ONDANSETRON HCL 4 MG PO TABS: 4.0000 mg | ORAL_TABLET | Freq: Three times a day (TID) | ORAL | 1 refills | Status: DC | PRN

## 2018-11-09 MED ORDER — 0.9 % SODIUM CHLORIDE (POUR BTL) OPTIME
TOPICAL | Status: DC | PRN
  Administered 2018-11-09: 1000 mL

## 2018-11-09 MED ORDER — PROMETHAZINE HCL 25 MG/ML IJ SOLN: 6.2500 mg | INTRAMUSCULAR | Status: DC | PRN

## 2018-11-09 MED ORDER — PROPOFOL 500 MG/50ML IV EMUL
INTRAVENOUS | Status: DC | PRN
  Administered 2018-11-09: 50 ug/kg/min via INTRAVENOUS

## 2018-11-09 MED ORDER — ROPIVACAINE HCL 5 MG/ML IJ SOLN
INTRAMUSCULAR | Status: DC | PRN
  Administered 2018-11-09: 30 mL via PERINEURAL

## 2018-11-09 MED ORDER — LACTATED RINGERS IV SOLN
INTRAVENOUS | Status: DC
  Administered 2018-11-09: 16:00:00 via INTRAVENOUS

## 2018-11-09 MED ORDER — DEXAMETHASONE SODIUM PHOSPHATE 10 MG/ML IJ SOLN
INTRAMUSCULAR | Status: DC | PRN
  Administered 2018-11-09: 10 mg via INTRAVENOUS

## 2018-11-09 MED ORDER — FENTANYL CITRATE (PF) 100 MCG/2ML IJ SOLN
INTRAMUSCULAR | Status: AC
  Administered 2018-11-09: 100 ug via INTRAVENOUS
  Filled 2018-11-09: qty 2

## 2018-11-09 MED ORDER — ONDANSETRON HCL 4 MG/2ML IJ SOLN
INTRAMUSCULAR | Status: DC | PRN
  Administered 2018-11-09: 4 mg via INTRAVENOUS

## 2018-11-09 MED ORDER — MIDAZOLAM HCL 2 MG/2ML IJ SOLN
INTRAMUSCULAR | Status: AC
  Administered 2018-11-09: 2 mg via INTRAVENOUS
  Filled 2018-11-09: qty 2

## 2018-11-09 MED ORDER — HYDROMORPHONE HCL 1 MG/ML IJ SOLN: 0.2500 mg | INTRAMUSCULAR | Status: DC | PRN

## 2018-11-09 MED ORDER — PROPOFOL 1000 MG/100ML IV EMUL
INTRAVENOUS | Status: AC
  Filled 2018-11-09: qty 100

## 2018-11-09 MED ORDER — PROPOFOL 10 MG/ML IV BOLUS
INTRAVENOUS | Status: AC
  Filled 2018-11-09: qty 20

## 2018-11-09 MED ORDER — CHLORHEXIDINE GLUCONATE 4 % EX LIQD: 60.0000 mL | Freq: Once | CUTANEOUS | Status: DC

## 2018-11-09 MED ORDER — OXYCODONE HCL 5 MG PO TABS: 5.0000 mg | ORAL_TABLET | Freq: Once | ORAL | Status: DC | PRN

## 2018-11-09 MED ORDER — OXYCODONE HCL 5 MG/5ML PO SOLN: 5.0000 mg | Freq: Once | ORAL | Status: DC | PRN

## 2018-11-09 MED ORDER — BUPIVACAINE HCL (PF) 0.25 % IJ SOLN: INTRAMUSCULAR | Status: DC | PRN

## 2018-11-09 MED ORDER — OXYCODONE-ACETAMINOPHEN 5-325 MG PO TABS
1.0000 | ORAL_TABLET | Freq: Once | ORAL | Status: AC
  Administered 2018-11-09: 1 via ORAL
  Filled 2018-11-09: qty 1

## 2018-11-09 MED ORDER — PROPOFOL 10 MG/ML IV BOLUS
INTRAVENOUS | Status: DC | PRN
  Administered 2018-11-09: 30 mg via INTRAVENOUS

## 2018-11-09 MED ORDER — EPHEDRINE SULFATE-NACL 50-0.9 MG/10ML-% IV SOSY
PREFILLED_SYRINGE | INTRAVENOUS | Status: DC | PRN
  Administered 2018-11-09: 7.5 mg via INTRAVENOUS
  Administered 2018-11-09 (×2): 5 mg via INTRAVENOUS

## 2018-11-09 MED ORDER — MIDAZOLAM HCL 2 MG/2ML IJ SOLN
INTRAMUSCULAR | Status: AC
  Filled 2018-11-09: qty 2

## 2018-11-09 MED ORDER — METHOCARBAMOL 500 MG PO TABS: 500.0000 mg | ORAL_TABLET | Freq: Four times a day (QID) | ORAL | 0 refills | Status: DC | PRN

## 2018-11-09 MED ORDER — CEFAZOLIN SODIUM-DEXTROSE 2-4 GM/100ML-% IV SOLN
2.0000 g | INTRAVENOUS | Status: AC
  Administered 2018-11-09: 2 g via INTRAVENOUS

## 2018-11-09 MED ORDER — CEFAZOLIN SODIUM-DEXTROSE 2-4 GM/100ML-% IV SOLN
INTRAVENOUS | Status: AC
  Filled 2018-11-09: qty 100

## 2018-11-09 MED ORDER — MIDAZOLAM HCL 2 MG/2ML IJ SOLN
2.0000 mg | Freq: Once | INTRAMUSCULAR | Status: AC
  Administered 2018-11-09: 16:00:00 2 mg via INTRAVENOUS

## 2018-11-09 MED ORDER — OXYCODONE HCL 5 MG PO TABS: 5.0000 mg | ORAL_TABLET | ORAL | 0 refills | Status: DC | PRN

## 2018-11-09 SURGICAL SUPPLY — 74 items
BANDAGE ACE 3X5.8 VEL STRL LF (GAUZE/BANDAGES/DRESSINGS) ×3 IMPLANT
BANDAGE ACE 4X5 VEL STRL LF (GAUZE/BANDAGES/DRESSINGS) ×2 IMPLANT
BANDAGE ELASTIC 4 VELCRO ST LF (GAUZE/BANDAGES/DRESSINGS) ×1 IMPLANT
BANDAGE ELASTIC 6 VELCRO ST LF (GAUZE/BANDAGES/DRESSINGS) ×2 IMPLANT
BIT DRILL 2.2 SS TIBIAL (BIT) ×1 IMPLANT
BLADE CLIPPER SURG (BLADE) IMPLANT
BNDG CMPR 9X4 STRL LF SNTH (GAUZE/BANDAGES/DRESSINGS) ×1
BNDG ESMARK 4X9 LF (GAUZE/BANDAGES/DRESSINGS) ×2 IMPLANT
BNDG GAUZE ELAST 4 BULKY (GAUZE/BANDAGES/DRESSINGS) ×4 IMPLANT
CANISTER SUCT 3000ML PPV (MISCELLANEOUS) ×2 IMPLANT
CORDS BIPOLAR (ELECTRODE) ×2 IMPLANT
COVER SURGICAL LIGHT HANDLE (MISCELLANEOUS) ×2 IMPLANT
COVER WAND RF STERILE (DRAPES) ×2 IMPLANT
CUFF TOURNIQUET SINGLE 18IN (TOURNIQUET CUFF) ×2 IMPLANT
CUFF TOURNIQUET SINGLE 24IN (TOURNIQUET CUFF) IMPLANT
DRAIN TLS ROUND 10FR (DRAIN) IMPLANT
DRAPE OEC MINIVIEW 54X84 (DRAPES) IMPLANT
DRAPE SURG 17X23 STRL (DRAPES) ×2 IMPLANT
DRSG ADAPTIC 3X8 NADH LF (GAUZE/BANDAGES/DRESSINGS) ×1 IMPLANT
GAUZE SPONGE 4X4 12PLY STRL (GAUZE/BANDAGES/DRESSINGS) ×2 IMPLANT
GAUZE SPONGE 4X4 12PLY STRL LF (GAUZE/BANDAGES/DRESSINGS) ×1 IMPLANT
GAUZE XEROFORM 1X8 LF (GAUZE/BANDAGES/DRESSINGS) ×3 IMPLANT
GAUZE XEROFORM 5X9 LF (GAUZE/BANDAGES/DRESSINGS) ×1 IMPLANT
GLOVE BIOGEL M 8.0 STRL (GLOVE) ×2 IMPLANT
GLOVE BIOGEL PI IND STRL 6.5 (GLOVE) IMPLANT
GLOVE BIOGEL PI IND STRL 8 (GLOVE) IMPLANT
GLOVE BIOGEL PI INDICATOR 6.5 (GLOVE) ×1
GLOVE BIOGEL PI INDICATOR 8 (GLOVE) ×1
GLOVE SS BIOGEL STRL SZ 8 (GLOVE) ×1 IMPLANT
GLOVE SUPERSENSE BIOGEL SZ 8 (GLOVE) ×1
GOWN STRL REUS W/ TWL LRG LVL3 (GOWN DISPOSABLE) ×1 IMPLANT
GOWN STRL REUS W/ TWL XL LVL3 (GOWN DISPOSABLE) ×2 IMPLANT
GOWN STRL REUS W/TWL LRG LVL3 (GOWN DISPOSABLE) ×2
GOWN STRL REUS W/TWL XL LVL3 (GOWN DISPOSABLE) ×4
KIT BASIN OR (CUSTOM PROCEDURE TRAY) ×2 IMPLANT
KIT TURNOVER KIT B (KITS) ×2 IMPLANT
LOOP VESSEL MAXI BLUE (MISCELLANEOUS) IMPLANT
MANIFOLD NEPTUNE II (INSTRUMENTS) ×2 IMPLANT
NEEDLE 22X1 1/2 (OR ONLY) (NEEDLE) IMPLANT
NS IRRIG 1000ML POUR BTL (IV SOLUTION) ×2 IMPLANT
PACK ORTHO EXTREMITY (CUSTOM PROCEDURE TRAY) ×2 IMPLANT
PAD ARMBOARD 7.5X6 YLW CONV (MISCELLANEOUS) ×4 IMPLANT
PAD CAST 3X4 CTTN HI CHSV (CAST SUPPLIES) ×1 IMPLANT
PAD CAST 4YDX4 CTTN HI CHSV (CAST SUPPLIES) ×1 IMPLANT
PADDING CAST COTTON 3X4 STRL (CAST SUPPLIES) ×2
PADDING CAST COTTON 4X4 STRL (CAST SUPPLIES) ×6
PEG LOCKING SMOOTH 2.2X18 (Peg) ×1 IMPLANT
PEG LOCKING SMOOTH 2.2X20 (Screw) ×3 IMPLANT
PEG LOCKING SMOOTH 2.2X22 (Screw) ×2 IMPLANT
PEG LOCKING SMOOTH 2.2X24 (Peg) ×1 IMPLANT
PLATE STD DVR LEFT (Plate) ×2 IMPLANT
PLATE STD DVR LT 24X55 (Plate) IMPLANT
SCREW LOCK 14X2.7X 3 LD TPR (Screw) IMPLANT
SCREW LOCKING 2.7X13MM (Screw) ×2 IMPLANT
SCREW LOCKING 2.7X14 (Screw) ×6 IMPLANT
SCREW LOCKING 2.7X15MM (Screw) ×1 IMPLANT
SCRUB BETADINE 4OZ XXX (MISCELLANEOUS) ×2 IMPLANT
SOL PREP POV-IOD 4OZ 10% (MISCELLANEOUS) ×2 IMPLANT
SPLINT FIBERGLASS 3X35 (CAST SUPPLIES) ×1 IMPLANT
SPONGE LAP 4X18 RFD (DISPOSABLE) IMPLANT
SUCTION FRAZIER HANDLE 10FR (MISCELLANEOUS) ×2
SUCTION TUBE FRAZIER 10FR DISP (MISCELLANEOUS) IMPLANT
SUT MNCRL AB 4-0 PS2 18 (SUTURE) ×2 IMPLANT
SUT PROLENE 3 0 PS 2 (SUTURE) IMPLANT
SUT PROLENE 4 0 PS 2 18 (SUTURE) ×2 IMPLANT
SUT VIC AB 3-0 FS2 27 (SUTURE) IMPLANT
SUT VIC AB 3-0 PS2 18 (SUTURE) ×1 IMPLANT
SYR CONTROL 10ML LL (SYRINGE) IMPLANT
SYSTEM CHEST DRAIN TLS 7FR (DRAIN) IMPLANT
TOWEL OR 17X24 6PK STRL BLUE (TOWEL DISPOSABLE) ×2 IMPLANT
TOWEL OR 17X26 10 PK STRL BLUE (TOWEL DISPOSABLE) ×2 IMPLANT
TUBE CONNECTING 12X1/4 (SUCTIONS) ×2 IMPLANT
TUBE EVACUATION TLS (MISCELLANEOUS) ×2 IMPLANT
WATER STERILE IRR 1000ML POUR (IV SOLUTION) ×2 IMPLANT

## 2018-11-09 NOTE — H&P (Signed)
Consultation thoroughly reviewed.  Patient has displaced intra-articular distal radius fracture with comminution  We will plan for open reduction internal fixation and repair reconstruction as necessary.    We are planning surgery for your upper extremity. The risk and benefits of surgery to include risk of bleeding, infection, anesthesia,  damage to normal structures and failure of the surgery to accomplish its intended goals of relieving symptoms and restoring function have been discussed in detail. With this in mind we plan to proceed. I have specifically discussed with the patient the pre-and postoperative regime and the dos and don'ts and risk and benefits in great detail. Risk and benefits of surgery also include risk of dystrophy(CRPS), chronic nerve pain, failure of the healing process to go onto completion and other inherent risks of surgery The relavent the pathophysiology of the disease/injury process, as well as the alternatives for treatment and postoperative course of action has been discussed in great detail with the patient who desires to proceed.  We will do everything in our power to help you (the patient) restore function to the upper extremity. It is a pleasure to see this patient today.   Tamarion Haymond MD

## 2018-11-09 NOTE — Anesthesia Procedure Notes (Signed)
Anesthesia Regional Block: Supraclavicular block   Pre-Anesthetic Checklist: ,, timeout performed, Correct Patient, Correct Site, Correct Laterality, Correct Procedure, Correct Position, site marked, Risks and benefits discussed,  Surgical consent,  Pre-op evaluation,  At surgeon's request and post-op pain management  Laterality: Left  Prep: chloraprep       Needles:  Injection technique: Single-shot  Needle Type: Stimiplex     Needle Length: 9cm  Needle Gauge: 21     Additional Needles:   Procedures:,,,, ultrasound used (permanent image in chart),,,,  Narrative:  Start time: 11/09/2018 3:43 PM End time: 11/09/2018 3:48 PM Injection made incrementally with aspirations every 5 mL.  Performed by: Personally  Anesthesiologist: Lynda Rainwater, MD

## 2018-11-09 NOTE — Op Note (Signed)
Operative note 11/09/2018  Roseanne Kaufman MD  Preoperative diagnosis: Comminuted complex greater than 5 part intra-articular distal radius fracture left upper extremity  Postop diagnosis: Same  Operative procedure: #1 open reduction internal fixation with DVR volar rim plate from [employer] left comminuted complex greater than 5 part intra-articular distal radius fracture #2 AP lateral and oblique x-rays performed examined and interpreted by myself IV series  Surgical and Lynnel Zanetti  Anesthesia block with IV sedation  Drains 1 drain pulled at the end of the case.  Estimated blood loss minimal  Tourniquet time less than an hour.  Operative indications patient is a pleasant female who fell today sustaining the above-mentioned fracture.  I have counseled her in regards to risk benefits of surgery and she desires to proceed with the above-mentioned operative intervention.  We have gone over all issues plans and concerns.  Operative procedure: Patient was seen by myself and anesthesia taken to the operative theater underwent smooth induction of IV sedation.  Preoperative the block looked excellent.  At this time we performed Hibiclens scrub x2.  Preoperative Ancef was given.  Once the preoperative scrub was given the patient then had Betadine scrub and paint and a sterile field secured.  Final timeout was observed with antibiotics infused I made a volar radial incision under 250 mm of tourniquet control.  At this time we gently released the FCR tendon sheath dorsally and palmarly followed by retraction of the carpal canal contents ulnarly.  The pronator was incised and following this I then carefully elevated the pronator from a radial to ulnar direction and performed ORIF.  We were able to achieve radial height inclination and volar tilt satisfactory to previous geometric parameters of normality and all looked well.  The plate was applied without difficulty.  This was a DVR volar rim plate.  Final screw  lengths were checked under radiograph and full range of motion was appreciated.  The patient does have a slight bit of ulnar positive variance and pre-existing cystic change in the lunate ulnarly.  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  This was noted.  The distal radial ulnar joint was stable.  The parameters look good and there was good fixation.  I was placed this.  I then irrigated copiously and repaired the pronator.  Following this repaired the skin edge over TLS drain which I removed on the way to the recovery room.  The patient tolerated this well.  She had soft compartments excellent refill and no complications.  This was an open reduction internal fixation volar Barton's intra-articular fracture highly comminuted.  She had excellent motion and stability at the end of the operation and all went quite well.  She will be monitored and DC'd home on Keflex x10 days 500 4 times daily as well as oxycodone and Zofran and methocarbamol as needed spasm.  We will see him in 14 days standard algorithm will be adhered to.  I will watch her distal radial ulnar joint and the lunate area due to the cystic change.  All questions have been encouraged and answered and I spoke with her husband on the phone tonight about the postoperative instructions.   Aanika Defoor MD

## 2018-11-09 NOTE — Discharge Instructions (Addendum)
Please go to Admitting at [facility]. Dr. Amedeo Plenty and Silvestre Gunner PA [phone] are aware of your arrival.

## 2018-11-09 NOTE — ED Triage Notes (Signed)
Pt was running with dog on trail, her dog ran in front of her and she tripped and fell.  There is deformity and pain to L wrist, and pain to L elbow

## 2018-11-09 NOTE — Consult Note (Signed)
Reason for Consult:Left wrist fx Referring Physician: Kaleah Hayes is an 58 y.o. female.  HPI: Sharon Hayes was trail running with her puppy when it crossed in front of her suddenly causing her to trip and fall onto her left outstretched hand. She had immediate pain and came to the ED at [facility]. X-rays showed a distal radius fx and hand surgery was consulted.  Past Medical History:  Diagnosis Date  . Colon polyps   . Complication of anesthesia    Hard to wake up.  . Varicose veins     Past Surgical History:  Procedure Laterality Date  . COLONOSCOPY  11/24/07   IRW:ERXVQM polyp at 15 cm anal verge otherwise normal  . COLONOSCOPY N/A 01/29/2015   Procedure: COLONOSCOPY;  Surgeon: Sharon Dolin, MD;  Location: [facility];  Service: Endoscopy;  Laterality: N/A;  0730  . ENDOVENOUS ABLATION SAPHENOUS VEIN W/ LASER Left 05-17-2015   endovenous laser ablation left greater saphenous vein and stab phlebectomy 10-20 incisions left leg by Sharon Jews MD   . PELVIC LAPAROSCOPY      Family History  Problem Relation Age of Onset  . Colon cancer Mother 75       colon  . Cancer Mother   . Varicose Veins Mother   . Heart disease Father   . Hyperlipidemia Father   . Hypertension Father   . Heart attack Father   . Cancer Maternal Aunt        uterine  . Breast cancer Maternal Aunt   . Colon polyps Sister     Social History:  reports that she has never smoked. She has never used smokeless tobacco. She reports current alcohol use of about 9.0 standard drinks of alcohol per week. She reports that she does not use drugs.  Allergies: No Known Allergies  Medications: I have reviewed the patient's current medications.  No results found for this or any previous visit (from the past 48 hour(s)).  Dg Forearm Left  Result Date: 11/09/2018 CLINICAL DATA:  Left forearm pain after fall. EXAM: LEFT FOREARM - 2 VIEW COMPARISON:  None. FINDINGS: Severely displaced and comminuted fracture is  seen involving the distal left radius with intra-articular extension. The ulna is unremarkable. No soft tissue abnormality is noted. IMPRESSION: Severely displaced and comminuted distal left radial fracture is noted with intra-articular extension. Electronically Signed   By: Sharon Hayes M.D.   On: 11/09/2018 12:54    Review of Systems  Constitutional: Negative for weight loss.  HENT: Negative for ear discharge, ear pain, hearing loss and tinnitus.   Eyes: Negative for blurred vision, double vision, photophobia and pain.  Respiratory: Negative for cough, sputum production and shortness of breath.   Cardiovascular: Negative for chest pain.  Gastrointestinal: Negative for abdominal pain, nausea and vomiting.  Genitourinary: Negative for dysuria, flank pain, frequency and urgency.  Musculoskeletal: Positive for joint pain (Left wrist). Negative for back pain, falls, myalgias and neck pain.  Neurological: Negative for dizziness, tingling, sensory change, focal weakness, loss of consciousness and headaches.  Endo/Heme/Allergies: Does not bruise/bleed easily.  Psychiatric/Behavioral: Negative for depression, memory loss and substance abuse. The patient is not nervous/anxious.    Blood pressure 128/74, pulse (!) 52, temperature (!) 97.5 F (36.4 C), temperature source Oral, resp. rate 16, height 5\' 7"  (1.702 m), weight 72.6 kg, SpO2 100 %. Physical Exam  Constitutional: She appears well-developed and well-nourished. No distress.  HENT:  Head: Normocephalic and atraumatic.  Eyes: Conjunctivae  are normal. Right eye exhibits no discharge. Left eye exhibits no discharge. No scleral icterus.  Neck: Normal range of motion.  Cardiovascular: Normal rate and regular rhythm.  Respiratory: Effort normal. No respiratory distress.  Musculoskeletal:     Comments: Left shoulder, elbow, wrist, digits- no skin wounds, wrist splinted  Sens  Ax/R/M/U intact  Mot   Ax/ R/ PIN/ M/ AIN/ U grossly intact   Neurological: She is alert.  Skin: Skin is warm and dry. She is not diaphoretic.  Psychiatric: She has a normal mood and affect. Her behavior is normal.    Assessment/Plan: Left wrist fx -- Plan ORIF this afternoon by Dr. Amedeo Hayes. Continue NPO. Anticipate discharge after surgery.    Sharon Abu, PA-C Orthopedic Surgery [phone] 11/09/2018, 3:07 PM

## 2018-11-09 NOTE — Discharge Instructions (Signed)

## 2018-11-09 NOTE — Anesthesia Preprocedure Evaluation (Addendum)
Anesthesia Evaluation  Patient identified by MRN, date of birth, ID band Patient awake    Reviewed: Allergy & Precautions, NPO status , Patient's Chart, lab work & pertinent test results  Airway Mallampati: II  TM Distance: >3 FB Neck ROM: Full    Dental no notable dental hx.    Pulmonary neg pulmonary ROS,    Pulmonary exam normal breath sounds clear to auscultation       Cardiovascular negative cardio ROS Normal cardiovascular exam Rhythm:Regular Rate:Normal     Neuro/Psych negative neurological ROS  negative psych ROS   GI/Hepatic negative GI ROS, Neg liver ROS,   Endo/Other  negative endocrine ROS  Renal/GU negative Renal ROS  negative genitourinary   Musculoskeletal negative musculoskeletal ROS (+)   Abdominal   Peds negative pediatric ROS (+)  Hematology negative hematology ROS (+)   Anesthesia Other Findings   Reproductive/Obstetrics negative OB ROS                             Anesthesia Physical Anesthesia Plan  ASA: II  Anesthesia Plan: MAC and Regional   Post-op Pain Management:  Regional for Post-op pain   Induction: Intravenous  PONV Risk Score and Plan: 3 and Ondansetron, Dexamethasone and Midazolam  Airway Management Planned: Simple Face Mask  Additional Equipment:   Intra-op Plan:   Post-operative Plan: Extubation in OR  Informed Consent: I have reviewed the patients History and Physical, chart, labs and discussed the procedure including the risks, benefits and alternatives for the proposed anesthesia with the patient or authorized representative who has indicated his/her understanding and acceptance.     Dental advisory given  Plan Discussed with: CRNA  Anesthesia Plan Comments:        Anesthesia Quick Evaluation

## 2018-11-09 NOTE — Transfer of Care (Signed)
Immediate Anesthesia Transfer of Care Note  Patient: Sharon Hayes  Procedure(s) Performed: OPEN REDUCTION INTERNAL FIXATION (ORIF) WRIST FRACTURE (Left Wrist)  Patient Location: PACU  Anesthesia Type:MAC combined with regional for post-op pain  Level of Consciousness: awake, alert  and oriented  Airway & Oxygen Therapy: Patient Spontanous Breathing and Patient connected to nasal cannula oxygen  Post-op Assessment: Report given to RN and Post -op Vital signs reviewed and stable  Post vital signs: Reviewed and stable  Last Vitals:  Vitals Value Taken Time  BP 115/68 11/09/2018  5:24 PM  Temp    Pulse 59 11/09/2018  5:34 PM  Resp 17 11/09/2018  5:34 PM  SpO2 98 % 11/09/2018  5:34 PM  Vitals shown include unvalidated device data.  Last Pain:  Vitals:   11/09/18 1550  TempSrc:   PainSc: 0-No pain      Patients Stated Pain Goal: 3 (15/52/08 0223)  Complications: No apparent anesthesia complications

## 2018-11-09 NOTE — ED Provider Notes (Addendum)
[facility] DEPT Provider Note   CSN: [id number] Arrival date & time: 11/09/18  1201    History   Chief Complaint Chief Complaint  Patient presents with  . Fall  . L wrist pain  . L elbow pain    HPI Sharon Hayes is a 58 y.o. female.     58 y.o female with no PMH presents to the ED with a chief complaint of left forearm pain x < 1 hour. Patient reports she was walking her puppy when she tripped and fell on an outstretched left hand, she reports trying to brace herself with her forearm. There is pain along the left wrist, left forearm, left elbow worse with palpation and movement. She has not taken any medication to help with her symptoms. Reports no other trauma aside from her left arm. She denies any LOC, or headache.      Past Medical History:  Diagnosis Date  . Colon polyps   . Complication of anesthesia    Hard to wake up.  . Varicose veins     Patient Active Problem List   Diagnosis Date Noted  . Varicose veins of both lower extremities with complications 40/98/1191  . History of colonic polyps 11/20/2014    Past Surgical History:  Procedure Laterality Date  . COLONOSCOPY  11/24/07   [id number] polyp at 15 cm anal verge otherwise normal  . COLONOSCOPY N/A 01/29/2015   Procedure: COLONOSCOPY;  Surgeon: Daneil Dolin, MD;  Location: [facility];  Service: Endoscopy;  Laterality: N/A;  0730  . ENDOVENOUS ABLATION SAPHENOUS VEIN W/ LASER Left 05-17-2015   endovenous laser ablation left greater saphenous vein and stab phlebectomy 10-20 incisions left leg by Curt Jews MD   . PELVIC LAPAROSCOPY       OB History    Gravida  4   Para  3   Term      Preterm      AB  1   Living  3     SAB  1   TAB      Ectopic      Multiple      Live Births               Home Medications    Prior to Admission medications   Medication Sig Start Date End Date Taking? Authorizing Provider  ibuprofen (ADVIL) 200 MG tablet  Take 400-600 mg by mouth every 6 (six) hours as needed for headache or mild pain.   Yes [provider]  amoxicillin-clavulanate (AUGMENTIN) 875-125 MG tablet Take 1 tablet by mouth 2 (two) times daily. Patient not taking: Reported on 11/09/2018 08/04/17   Kennyth Arnold, FNP  cephALEXin (KEFLEX) 500 MG capsule Take 1 capsule (500 mg total) by mouth 2 (two) times daily. Patient not taking: Reported on 11/09/2018 05/29/18   Benjamine Mola, FNP  Estradiol 10 MCG TABS vaginal tablet Place 1 tablet (10 mcg total) vaginally 2 (two) times a week. Patient not taking: Reported on 11/09/2018 01/31/16   Terrance Mass, MD  predniSONE (STERAPRED UNI-PAK 21 TAB) 10 MG (21) TBPK tablet As directed Patient not taking: Reported on 11/09/2018 02/09/17   Kennyth Arnold, FNP    Family History Family History  Problem Relation Age of Onset  . Colon cancer Mother 77       colon  . Cancer Mother   . Varicose Veins Mother   . Heart disease Father   . Hyperlipidemia Father   .  Hypertension Father   . Heart attack Father   . Cancer Maternal Aunt        uterine  . Breast cancer Maternal Aunt   . Colon polyps Sister     Social History Social History   Tobacco Use  . Smoking status: Never Smoker  . Smokeless tobacco: Never Used  Substance Use Topics  . Alcohol use: Yes    Alcohol/week: 9.0 standard drinks    Types: 7 Glasses of wine, 2 Shots of liquor per week    Comment: 1-2 glasses of wine approx 4 days a week  . Drug use: No     Allergies   Patient has no known allergies.   Review of Systems Review of Systems  Constitutional: Negative for chills and fever.  HENT: Negative for ear pain and sore throat.   Eyes: Negative for pain and visual disturbance.  Respiratory: Negative for cough and shortness of breath.   Cardiovascular: Negative for chest pain and palpitations.  Gastrointestinal: Negative for abdominal pain and vomiting.  Genitourinary: Negative for dysuria and hematuria.   Musculoskeletal: Positive for arthralgias and joint swelling. Negative for back pain.  Skin: Negative for color change and rash.  Neurological: Negative for seizures and syncope.  All other systems reviewed and are negative.    Physical Exam Updated Vital Signs BP 129/89   Pulse (!) 57   Temp 97.9 F (36.6 C) (Oral)   Resp 16   Ht 5\' 7"  (1.702 m)   Wt 72.6 kg   SpO2 100%   BMI 25.06 kg/m   Physical Exam Vitals signs and nursing note reviewed.  Constitutional:      General: She is not in acute distress.    Appearance: Normal appearance.  HENT:     Head: Normocephalic and atraumatic.     Right Ear: Tympanic membrane normal.     Left Ear: Tympanic membrane normal.     Mouth/Throat:     Mouth: Mucous membranes are moist.  Eyes:     Pupils: Pupils are equal, round, and reactive to light.  Neck:     Musculoskeletal: Normal range of motion and neck supple.  Cardiovascular:     Rate and Rhythm: Normal rate and regular rhythm.  Pulmonary:     Effort: Pulmonary effort is normal.     Breath sounds: Normal breath sounds.  Abdominal:     General: Abdomen is flat. There is no distension.     Tenderness: There is no right CVA tenderness or left CVA tenderness.  Musculoskeletal:        General: Swelling, tenderness, deformity and signs of injury present.     Left forearm: She exhibits tenderness, bony tenderness, swelling, edema and deformity. She exhibits no laceration.     Comments: Pulses are diminished, swelling noted to left wrist. Limited ROM. Patient is able to wiggle her fingers, capillary refill is intact.   Skin:    General: Skin is warm and dry.  Neurological:     Mental Status: She is alert and oriented to person, place, and time.      ED Treatments / Results  Labs (all labs ordered are listed, but only abnormal results are displayed) Labs Reviewed - No data to display  EKG None  Radiology Dg Forearm Left  Result Date: 11/09/2018 CLINICAL DATA:  Left  forearm pain after fall. EXAM: LEFT FOREARM - 2 VIEW COMPARISON:  None. FINDINGS: Severely displaced and comminuted fracture is seen involving the distal left radius with intra-articular extension.  The ulna is unremarkable. No soft tissue abnormality is noted. IMPRESSION: Severely displaced and comminuted distal left radial fracture is noted with intra-articular extension. Electronically Signed   By: Marijo Conception M.D.   On: 11/09/2018 12:54    Procedures Procedures (including critical care time)  Medications Ordered in ED Medications  oxyCODONE-acetaminophen (PERCOCET/ROXICET) 5-325 MG per tablet 1 tablet (1 tablet Oral Given 11/09/18 1226)     Initial Impression / Assessment and Plan / ED Course  I have reviewed the triage vital signs and the nursing notes.  Pertinent labs & imaging results that were available during my care of the patient were reviewed by me and considered in my medical decision making (see chart for details).   Patient with no pertinent PMH presents to the ED s/p fall while walking her puppy. She reports pain along the left forearm, obvious deformity noted, swelling to the left forearm and wrist. Some pain at the left shoulder as well but full ROM. Given percocet for pain while xray's are pending. DG left forearm showed: Severely displaced and comminuted distal left radial fracture is  noted with intra-articular extension.   Spoke with Silvestre Gunner PA from orthopedics who advised patient will need to go to admitting at [facility] to see Dr. Amedeo Plenty, she is to be placed on a sugar tong splint for transfer.  Patient prefers to go by POV, husband is on her way to take her to [facility]. I have discussed this patient with Dr. Gilford Raid who has seen patient and agrees with management.   Portions of this note were generated with [employer]. Dictation errors may occur despite best attempts at proofreading.    Final Clinical Impressions(s) / ED Diagnoses   Final  diagnoses:  Fall, initial encounter  Closed fracture of distal end of left radius, unspecified fracture morphology, initial encounter    ED Discharge Orders    None       Janeece Fitting, PA-C 11/09/18 1353    Janeece Fitting, PA-C 11/09/18 1354    Isla Pence, MD 11/09/18 1406

## 2018-11-09 NOTE — Anesthesia Postprocedure Evaluation (Signed)
Anesthesia Post Note  Patient: Sharon Hayes  Procedure(s) Performed: OPEN REDUCTION INTERNAL FIXATION (ORIF) WRIST FRACTURE (Left Wrist)     Patient location during evaluation: PACU Anesthesia Type: General Level of consciousness: awake Pain management: pain level controlled Vital Signs Assessment: post-procedure vital signs reviewed and stable Respiratory status: spontaneous breathing Cardiovascular status: stable Postop Assessment: no apparent nausea or vomiting Anesthetic complications: no    Last Vitals:  Vitals:   11/09/18 1745 11/09/18 1756  BP:    Pulse: (!) 58   Resp: 18   Temp:  (!) 36.4 C  SpO2: 100%     Last Pain:  Vitals:   11/09/18 1745  TempSrc:   PainSc: 0-No pain                 Tuyen Uncapher

## 2018-11-10 ENCOUNTER — Encounter (HOSPITAL_COMMUNITY): Payer: Self-pay | Admitting: Orthopedic Surgery

## 2018-11-29 DIAGNOSIS — S52502A Unspecified fracture of the lower end of left radius, initial encounter for closed fracture: Secondary | ICD-10-CM | POA: Insufficient documentation

## 2018-12-20 ENCOUNTER — Encounter (HOSPITAL_COMMUNITY): Payer: Self-pay

## 2018-12-20 ENCOUNTER — Other Ambulatory Visit: Payer: Self-pay

## 2018-12-20 ENCOUNTER — Ambulatory Visit (HOSPITAL_COMMUNITY): Payer: 59 | Attending: Orthopedic Surgery

## 2018-12-20 DIAGNOSIS — R29898 Other symptoms and signs involving the musculoskeletal system: Secondary | ICD-10-CM

## 2018-12-20 DIAGNOSIS — M25532 Pain in left wrist: Secondary | ICD-10-CM

## 2018-12-20 DIAGNOSIS — M25632 Stiffness of left wrist, not elsewhere classified: Secondary | ICD-10-CM | POA: Diagnosis present

## 2018-12-20 NOTE — Patient Instructions (Signed)
*   Continue with scar massage and manual therapy to left wrist as needed.    Wrist extension with wall stretch  Place your fingers on the wall facing backward, then straighten your elbow and turn away as you press your palm to the wall   Hold for 15-20 seconds. Repeat two times.   PRAYER STRETCH - WRIST  Place the palms of your hands together to stretch the wrist as shown. Hold 15-20 seconds. Repeat two times.     WRIST EXTENSOR STRETCH  Use your unaffected hand to bend the affected wrist down as shown.   Keep the elbow straight on the affected side the entire time. Hold 15-20 seconds. Repeat two times.   AROM: Wrist Flexion / Extension  Actively bend right wrist forward then back as far as possible. Repeat _10-15___ times per set. Do _1___ sets per session. Do _2-3___ sessions per day.  Copyright  VHI. All rights reserved.  AROM: Wrist Radial / Ulnar Deviation   Gently bend left wrist from side to side as far as possible. Repeat __10-15__ times per set. Do __1__ sets per session. Do _2-3___ sessions per day.  Copyright  VHI. All rights reserved.  AROM: Forearm Pronation / Supination   With right arm in handshake position, slowly rotate palm down until stretch is felt. Relax. Then rotate palm up until stretch is felt. Repeat _10-15___ times per set. Do __1__ sets per session. Do __2-3__ sessions per day.  Copyright  VHI. All rights reserved.    Home Exercises Program Theraputty Exercises  Do the following exercises 3-4 times a day using your affected hand.  1. Roll putty into a ball.  2. Make into a pancake.  3. Roll putty into a roll.  4. Pinch along log with first finger and thumb.   5. Make into a ball.  6. Roll it back into a log.   7. Pinch using thumb and side of first finger.  8. Roll into a ball, then flatten into a pancake.  9. Using your fingers, make putty into a mountain.  10. Squeeze and release ball of putty 10-12 times.

## 2018-12-20 NOTE — Therapy (Signed)
[facility] [address] [place], [place], [address] Phone: [phone]   Fax:  [phone]  Occupational Therapy Evaluation  Patient Details  Name: Sharon Hayes MRN: [id number] Date of Birth: Nov 21, 1960 Referring Provider (OT): Dr. Roseanne Kaufman   Encounter Date: 12/20/2018  OT End of Session - 12/20/18 1108    Visit Number  1    Number of Visits  6    Date for OT Re-Evaluation  01/31/19    Authorization Type  [employer]    Authorization Time Period  60 visit limit PT/OT combined. 0 used    Authorization - Visit Number  1    Authorization - Number of Visits  60    OT Start Time  0930    OT Stop Time  1016    OT Time Calculation (min)  46 min    Activity Tolerance  Patient tolerated treatment well    Behavior During Therapy  WFL for tasks assessed/performed       Past Medical History:  Diagnosis Date  . Colon polyps   . Complication of anesthesia    Hard to wake up.  . Varicose veins     Past Surgical History:  Procedure Laterality Date  . COLONOSCOPY  11/24/07   [id number] polyp at 15 cm anal verge otherwise normal  . COLONOSCOPY N/A 01/29/2015   Procedure: COLONOSCOPY;  Surgeon: Daneil Dolin, MD;  [facility];  Service: Endoscopy;  Laterality: N/A;  0730  . ENDOVENOUS ABLATION SAPHENOUS VEIN W/ LASER Left 05-17-2015   endovenous laser ablation left greater saphenous vein and stab phlebectomy 10-20 incisions left leg by Curt Jews MD   . ORIF WRIST FRACTURE Left 11/09/2018   Procedure: OPEN REDUCTION INTERNAL FIXATION (ORIF) WRIST FRACTURE;  Surgeon: Roseanne Kaufman, MD;  Location: [facility];  Service: Orthopedics;  Laterality: Left;  . PELVIC LAPAROSCOPY      There were no vitals filed for this visit.  Subjective Assessment - 12/20/18 1102    Subjective   S: I've been really working on it at home.    Pertinent History  Patient is a 58 y/o female S/P left distal radius fracture with surgical repair  with internal fixation on 11/09/18. Patient is recently out of long arm splint. She is wearing a soft wrist splint for comfort measures. Dr. Amedeo Plenty has referred patient to occupational therapy for evaluation and treatment.     Patient Stated Goals  TO increase ROM and strength and return to work.     Currently in Pain?  No/denies   pain with activity 3-4/10       [facility] OT Assessment - 12/20/18 0936      Assessment   Medical Diagnosis  left distal radius fracture    Referring Provider (OT)  Dr. Roseanne Kaufman    Onset Date/Surgical Date  11/09/18    Hand Dominance  Right    Next MD Visit  01/05/19    Prior Therapy  None for this condition      Precautions   Precautions  Other (comment)    Precaution Comments  Weeks 4-8: gentle ROM. Progress to strengthening at 8 weeks (01/09/19).     Required Braces or Orthoses  Other Brace/Splint    Other Brace/Splint  Wrist brace for comfort      Restrictions   Weight Bearing Restrictions  Yes    LUE Weight Bearing  Non weight bearing      Balance Screen   Has  the patient fallen in the past 6 months  Yes    How many times?  5    Has the patient had a decrease in activity level because of a fear of falling?   No    Is the patient reluctant to leave their home because of a fear of falling?   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 pan of broth)      Mobility   Mobility Status  Independent      Written  Expression   Dominant Hand  Right      Vision - History   Baseline Vision  Wears glasses only for reading      Cognition   Overall Cognitive Status  Within Functional Limits for tasks assessed      Observation/Other Assessments   Focus on Therapeutic Outcomes (FOTO)   N/A      Sensation   Additional Comments  Mild numbness/tingling reported in fingertips      Coordination   9 Hole Peg Test  Right;Left    Right 9 Hole Peg Test  18.9"    Left 9 Hole Peg Test  25.9"      Edema   Edema  Left wrist: 17 cm right wrist: 15.5 cm No edema in MCP joints      ROM / Strength   AROM / PROM / Strength  Strength;PROM;AROM      Palpation   Palpation comment  Min fascial restrictions along volar and dorsal aspect of left forearm and wrist.       AROM   Overall AROM Comments  Assessed seated.    AROM Assessment Site  Forearm;Wrist    Right/Left Forearm  Right;Left    Right Forearm Pronation  90 Degrees    Right Forearm Supination  90 Degrees    Left Forearm Pronation  90 Degrees    Left Forearm Supination  70 Degrees    Right/Left Wrist  Right    Right Wrist Extension  78 Degrees    Right Wrist Flexion  84 Degrees    Right Wrist Radial Deviation  36 Degrees    Right Wrist Ulnar Deviation  30 Degrees    Left Wrist Extension  54 Degrees    Left Wrist Flexion  34 Degrees    Left Wrist Radial Deviation  28 Degrees    Left Wrist Ulnar Deviation  8 Degrees      PROM   Overall PROM Comments  Assessed seated.    PROM Assessment Site  Wrist;Forearm    Right/Left Forearm  Left    Left Forearm Supination  70 Degrees    Right/Left Wrist  Left    Left Wrist Extension  70 Degrees    Left Wrist Flexion  54 Degrees    Left Wrist Radial Deviation  40 Degrees  Left Wrist Ulnar Deviation  26 Degrees      Strength   Strength Assessment Site  Hand    Right/Left hand  Right;Left    Right Hand Grip (lbs)  75    Right Hand Lateral Pinch  14 lbs    Right Hand 3 Point Pinch  16 lbs    Left Hand  Grip (lbs)  20    Left Hand Lateral Pinch  6 lbs    Left Hand 3 Point Pinch  4 lbs                      OT Education - 12/20/18 1107    Education Details  wrist stretches, A/ROM wrist and forearm exercises, yellow putty for hand and pinch strength.     Person(s) Educated  Patient    Methods  Explanation;Handout    Comprehension  Verbalized understanding       OT Short Term Goals - 12/20/18 1112      OT SHORT TERM GOAL #1   Title  patient will be educated and independent with HEP to increase functional performance during ADL tasks and allow her to return to work at her highest level of independence.     Time  6    Period  Weeks    Status  New    Target Date  01/31/19      OT SHORT TERM GOAL #2   Title  Patient will increase left wrist A/ROM to WNL to increase ability to complete manual techniques at work requiring full wrist extension and flexion.    Time  6    Period  Weeks    Status  New      OT SHORT TERM GOAL #3   Title  Patient will increase wrist strength to 5/5 in order to transfer patients and lift body parts when at work without difficulty.     Time  6    Period  Weeks    Status  New      OT SHORT TERM GOAL #4   Title  Patient will decrease fascial restrictions and edema to trace amount in her left wrist in order to increase functional mobility needed to complete daily tasks.    Time  6    Period  Weeks    Status  New      OT SHORT TERM GOAL #5   Title  Patient will decrease pain level in left wrist to 2/10 or less with activity when completing tasks such as lifting pots and pans or items in the kitchen.    Time  6    Period  Weeks    Status  New               Plan - 12/20/18 1109    Clinical Impression Statement  A: Patient is a 58 y/o female S/P left distal radius fracture causing increased pain, edema, and fascial restrictions and decreased ROM and strength resulting in difficulty completing ADL tasks using her Left hand.     OT  Occupational Profile and History  Detailed Assessment- Review of Records and additional review of physical, cognitive, psychosocial history related to current functional performance    Occupational performance deficits (Please refer to evaluation for details):  ADL's;Work    Body Structure / Function / Physical Skills  ADL;Decreased knowledge of precautions;Flexibility;ROM;UE functional use;Scar mobility;FMC;Sensation;GMC;Edema;Pain;Strength;Fascial restriction;Coordination    Rehab Potential  Excellent    Clinical Decision Making  Several treatment options,  min-mod task modification necessary    Comorbidities Affecting Occupational Performance:  May have comorbidities impacting occupational performance    Modification or Assistance to Complete Evaluation   No modification of tasks or assist necessary to complete eval    OT Frequency  1x / week    OT Duration  6 weeks    OT Treatment/Interventions  Self-care/ADL training;Electrical Stimulation;Therapeutic exercise;Moist Heat;Paraffin;Neuromuscular education;Patient/family education;Splinting;Therapeutic activities;Passive range of motion;Manual Therapy;Ultrasound;Cryotherapy    Plan  P: patient will benefit from skilled OT services to increase functional performance during ADL tasks using her left UE as her non dominant. Treatment Plan: Myofascial release as needed, muscle energy technique, P/ROM, A/ROM, grip and pinch strengthening, wrist strengthening.  Modalities PRN.    Consulted and Agree with Plan of Care  Patient       Patient will benefit from skilled therapeutic intervention in order to improve the following deficits and impairments:  Body Structure / Function / Physical Skills  Visit Diagnosis: Other symptoms and signs involving the musculoskeletal system - Plan: Ot plan of care cert/re-cert  Pain in left wrist - Plan: Ot plan of care cert/re-cert  Stiffness of left wrist, not elsewhere classified - Plan: Ot plan of care  cert/re-cert    Problem List Patient Active Problem List   Diagnosis Date Noted  . Varicose veins of both lower extremities with complications 74/25/9563  . History of colonic polyps 11/20/2014   Ailene Ravel, OTR/L,CBIS  [phone]  12/20/2018, 11:16 AM  [facility] [address] [place], [place], [address] Phone: [phone]   Fax:  [phone]  Name: Sharon Hayes MRN: [id number] Date of Birth: Dec 26, 1960

## 2018-12-29 ENCOUNTER — Ambulatory Visit (HOSPITAL_COMMUNITY): Payer: 59

## 2018-12-29 ENCOUNTER — Encounter (HOSPITAL_COMMUNITY): Payer: Self-pay

## 2018-12-29 ENCOUNTER — Other Ambulatory Visit: Payer: Self-pay

## 2018-12-29 DIAGNOSIS — M25632 Stiffness of left wrist, not elsewhere classified: Secondary | ICD-10-CM

## 2018-12-29 DIAGNOSIS — M25532 Pain in left wrist: Secondary | ICD-10-CM

## 2018-12-29 DIAGNOSIS — R29898 Other symptoms and signs involving the musculoskeletal system: Secondary | ICD-10-CM

## 2018-12-29 NOTE — Therapy (Signed)
[facility] [place], [place], [address] Phone: [phone]   Fax:  [phone]  Occupational Therapy Treatment  Patient Details  Name: Sharon Hayes MRN: [id number] Date of Birth: Jan 20, 1961 Referring Provider (OT): Dr. Roseanne Kaufman   Encounter Date: 12/29/2018  OT End of Session - 12/29/18 0913    Visit Number  2    Number of Visits  6    Date for OT Re-Evaluation  01/31/19    Authorization Type  [employer] healthcare    Authorization Time Period  60 visit limit PT/OT combined. 0 used    Authorization - Visit Number  2    Authorization - Number of Visits  34    OT Start Time  [id number]    OT Stop Time  0905    OT Time Calculation (min)  28 min    Activity Tolerance  Patient tolerated treatment well    Behavior During Therapy  WFL for tasks assessed/performed       Past Medical History:  Diagnosis Date  . Colon polyps   . Complication of anesthesia    Hard to wake up.  . Varicose veins     Past Surgical History:  Procedure Laterality Date  . COLONOSCOPY  11/24/07   [id number] polyp at 15 cm anal verge otherwise normal  . COLONOSCOPY N/A 01/29/2015   Procedure: COLONOSCOPY;  Surgeon: Daneil Dolin, MD;  [facility];  Service: Endoscopy;  Laterality: N/A;  0730  . ENDOVENOUS ABLATION SAPHENOUS VEIN W/ LASER Left 05-17-2015   endovenous laser ablation left greater saphenous vein and stab phlebectomy 10-20 incisions left leg by Curt Jews MD   . ORIF WRIST FRACTURE Left 11/09/2018   Procedure: OPEN REDUCTION INTERNAL FIXATION (ORIF) WRIST FRACTURE;  Surgeon: Roseanne Kaufman, MD;  Location: [facility];  Service: Orthopedics;  Laterality: Left;  . PELVIC LAPAROSCOPY      There were no vitals filed for this visit.  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    Currently in Pain?  Yes         [facility]  OT Assessment - 12/29/18 0850      Assessment   Medical Diagnosis  left distal radius fracture      Precautions   Precautions  Other (comment)    Precaution Comments  Weeks 4-8: gentle ROM. Progress to strengthening at 8 weeks (01/09/19).     Required Braces or Orthoses  Other Brace/Splint    Other Brace/Splint  Wrist brace for comfort               OT Treatments/Exercises (OP) - 12/29/18 0850      Exercises   Exercises  Wrist;Hand      Weighted Stretch Over Towel Roll   Supination - Weighted Stretch  2 pounds    Supination Weighted Stretch Limitations  2x60"    Wrist Flexion - Weighted Stretch  2 pounds    Wrist Flexion Weighted Stretch Limitations  2x60"    Wrist Extension - Weighted Stretch  3 pounds    Wrist Extension Weighted Stretch Limitations  2x60"    Radial Deviation - Weighted Stretch  2 pounds    Radial Deviation - Weighed Stretch Limitations  2x30"      Wrist Exercises   Other wrist exercises  Pt utilized red resistive clothespins  and a 3 point pinch to pick up 20 sponges and place in container.      Additional Wrist Exercises   Sponges  23      Manual Therapy   Manual Therapy  Myofascial release    Manual therapy comments  Manual therapy completed prior to exercises.    Myofascial Release  Myofascial release and manual stretching completed to left forearm and wrist to decrease fascial restrictions and increase joint mobility in a pain free zone.                OT Short Term Goals - 12/29/18 0855      OT SHORT TERM GOAL #1   Title  patient will be educated and independent with HEP to increase functional performance during ADL tasks and allow her to return to work at her highest level of independence.     Time  6    Period  Weeks    Status  On-going    Target Date  01/31/19      OT SHORT TERM GOAL #2   Title  Patient will increase left wrist A/ROM to WNL to increase ability to complete manual techniques at work requiring full wrist  extension and flexion.    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     Time  6    Period  Weeks    Status  On-going      OT SHORT TERM GOAL #4   Title  Patient will decrease fascial restrictions and edema to trace amount in her left wrist in order to increase functional mobility needed to complete daily tasks.    Time  6    Period  Weeks    Status  On-going      OT SHORT TERM GOAL #5   Title  Patient will decrease pain level in left wrist to 2/10 or less with activity when completing tasks such as lifting pots and pans or items in the kitchen.    Time  6    Period  Weeks    Status  On-going               Plan - 12/29/18 0914    Clinical Impression Statement  A: Initiated myofascial release, manual stretching, weighted wrist stretches, and pinch strengthening. patient continues to be limited with wrist flexion with pain limiting intensity of stretching. 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 VC for form and technique as needed.    Body Structure / Function / Physical Skills  ADL;Decreased knowledge of precautions;Flexibility;ROM;UE functional use;Scar mobility;FMC;Sensation;GMC;Edema;Pain;Strength;Fascial restriction;Coordination    Plan  P: Attempt muscle energy technique to increase flexion and extension of wrist. Progress to red theraputty if appropriate.    Consulted and Agree with Plan of Care  Patient       Patient will benefit from skilled therapeutic intervention in order to improve the following deficits and impairments:   Body Structure / Function / Physical Skills: ADL, Decreased knowledge of precautions, Flexibility, ROM, UE functional use, Scar mobility, FMC, Sensation, GMC, Edema, Pain, Strength, Fascial restriction, Coordination        Visit Diagnosis: 1. Other symptoms and signs involving the musculoskeletal system  2. Pain in left wrist   3. Stiffness of left wrist, not elsewhere classified       Problem List Patient Active Problem List   Diagnosis Date Noted  . Varicose veins of both lower extremities with complications 01/65/8006  . History of colonic polyps 11/20/2014   Ailene Ravel, OTR/L,CBIS  [phone]  12/29/2018, 9:18 AM  [facility] [address] [place], [place], [address] Phone: [phone]   Fax:  [phone]  Name: Sharon Hayes MRN: [id number] Date of Birth: 1961/05/12

## 2019-01-05 ENCOUNTER — Other Ambulatory Visit: Payer: Self-pay

## 2019-01-05 ENCOUNTER — Encounter (HOSPITAL_COMMUNITY): Payer: Self-pay

## 2019-01-05 ENCOUNTER — Ambulatory Visit (HOSPITAL_COMMUNITY): Payer: 59

## 2019-01-05 DIAGNOSIS — M25632 Stiffness of left wrist, not elsewhere classified: Secondary | ICD-10-CM

## 2019-01-05 DIAGNOSIS — R29898 Other symptoms and signs involving the musculoskeletal system: Secondary | ICD-10-CM | POA: Diagnosis not present

## 2019-01-05 DIAGNOSIS — M25532 Pain in left wrist: Secondary | ICD-10-CM

## 2019-01-05 NOTE — Therapy (Signed)
[facility] [place], [place], [address] Phone: [phone]   Fax:  [phone]  Occupational Therapy Treatment  Patient Details  Name: Sharon Hayes MRN: [id number] Date of Birth: [birth year] Referring Provider (OT): Dr. Roseanne Kaufman   Encounter Date: 01/05/2019  OT End of Session - 01/05/19 1200    Visit Number  3    Number of Visits  6    Date for OT Re-Evaluation  01/31/19    Authorization Type  [employer] healthcare    Authorization Time Period  60 visit limit PT/OT combined. 0 used    Authorization - Visit Number  3    Authorization - Number of Visits  76    OT Start Time  1133    OT Stop Time  1216    OT Time Calculation (min)  43 min    Activity Tolerance  Patient tolerated treatment well    Behavior During Therapy  WFL for tasks assessed/performed       Past Medical History:  Diagnosis Date  . Colon polyps   . Complication of anesthesia    Hard to wake up.  . Varicose veins     Past Surgical History:  Procedure Laterality Date  . COLONOSCOPY  11/24/07   [id number] polyp at 15 cm anal verge otherwise normal  . COLONOSCOPY N/A 01/29/2015   Procedure: COLONOSCOPY;  Surgeon: Daneil Dolin, MD;  [facility];  Service: Endoscopy;  Laterality: N/A;  0730  . ENDOVENOUS ABLATION SAPHENOUS VEIN W/ LASER Left 05-17-2015   endovenous laser ablation left greater saphenous vein and stab phlebectomy 10-20 incisions left leg by Curt Jews MD   . ORIF WRIST FRACTURE Left 11/09/2018   Procedure: OPEN REDUCTION INTERNAL FIXATION (ORIF) WRIST FRACTURE;  Surgeon: Roseanne Kaufman, MD;  Location: [facility];  Service: Orthopedics;  Laterality: Left;  . PELVIC LAPAROSCOPY      There were no vitals filed for this visit.  Subjective Assessment - 01/05/19 1200    Subjective   S: I see the MD this afternoon for a follow up.    Currently in Pain?  No/denies   just feels stiff. No pain.        [facility] OT Assessment -  01/05/19 1147      Assessment   Medical Diagnosis  left distal radius fracture      Precautions   Precautions  Other (comment)    Precaution Comments  Weeks 4-8: gentle ROM. Progress to strengthening at 8 weeks (01/09/19).     Required Braces or Orthoses  Other Brace/Splint    Other Brace/Splint  Wrist brace for comfort      Restrictions   Weight Bearing Restrictions  Yes    LUE Weight Bearing  Non weight bearing      AROM   Overall AROM Comments  Assessed seated.    AROM Assessment Site  Forearm;Wrist    Right/Left Forearm  Left    Left Forearm Supination  90 Degrees   previous: 70   Left Wrist Extension  60 Degrees   previous: 54   Left Wrist Flexion  54 Degrees   previous: 34   Left Wrist Radial Deviation  32 Degrees   previous: 28   Left Wrist Ulnar Deviation  20 Degrees   previous: 8     PROM   Left Wrist Extension  88 Degrees   previous: 70   Left Wrist Flexion  60 Degrees   previous: 54  Strength   Left Hand Grip (lbs)  30   previous: 20   Left Hand Lateral Pinch  9 lbs   previous: 6   Left Hand 3 Point Pinch  6 lbs   previous: 4              OT Treatments/Exercises (OP) - 01/05/19 1157      Exercises   Exercises  Wrist;Hand;Theraputty      Weighted Stretch Over Towel Roll   Supination - Weighted Stretch  2 pounds    Supination Weighted Stretch Limitations  2x60"    Wrist Flexion - Weighted Stretch  2 pounds    Wrist Flexion Weighted Stretch Limitations  2x60"    Wrist Extension - Weighted Stretch  3 pounds    Wrist Extension Weighted Stretch Limitations  2x60"    Radial Deviation - Weighted Stretch  2 pounds    Radial Deviation - Weighed Stretch Limitations  2x30"      Wrist Exercises   Wrist Flexion  PROM;10 reps    Wrist Extension  PROM;10 reps    Wrist Radial Deviation  PROM;10 reps    Wrist Ulnar Deviation  PROM;10 reps    Other wrist exercises  Supination; 10X; P/ROM      Hand Exercises   Other Hand Exercises  pt utilized pvc  pipe to push circles into putty focusing on grip strength and wrist flexion and extension ROM.       Theraputty   Theraputty - Flatten  red- seated    Theraputty - Roll  red    Theraputty - Grip  red    Theraputty - Pinch  red      Manual Therapy   Manual Therapy  Myofascial release    Manual therapy comments  Manual therapy completed prior to exercises.    Myofascial Release  Myofascial release and manual stretching completed to left forearm and wrist to decrease fascial restrictions and increase joint mobility in a pain free zone.              OT Education - 01/05/19 1226    Education Details  Patient provided with red theraputty and instructed to upgrade grip and pinch HEP at home.    Person(s) Educated  Patient    Methods  Explanation    Comprehension  Verbalized understanding       OT Short Term Goals - 12/29/18 0855      OT SHORT TERM GOAL #1   Title  patient will be educated and independent with HEP to increase functional performance during ADL tasks and allow her to return to work at her highest level of independence.     Time  6    Period  Weeks    Status  On-going    Target Date  01/31/19      OT SHORT TERM GOAL #2   Title  Patient will increase left wrist A/ROM to WNL to increase ability to complete manual techniques at work requiring full wrist extension and flexion.    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     Time  6    Period  Weeks    Status  On-going      OT SHORT TERM GOAL #4  Title  Patient will decrease fascial restrictions and edema to trace amount in her left wrist in order to increase functional mobility needed to complete daily tasks.    Time  6    Period  Weeks    Status  On-going      OT SHORT TERM GOAL #5   Title  Patient will decrease pain level in left wrist to  2/10 or less with activity when completing tasks such as lifting pots and pans or items in the kitchen.    Time  6    Period  Weeks    Status  On-going               Plan - 01/05/19 1226    Clinical Impression Statement  A: A/ROM and strength measurements were completed this session as patient has a follow up appointment with MD at 1:00 today. Patient has made progress with all grip/pinch strengthening and A/ROM measurements. Upgraded to red theraputty during session to focus on grip and pinch strengthening. No reports of pain during weighted wrist stretches.    Body Structure / Function / Physical Skills  ADL;Decreased knowledge of precautions;Flexibility;ROM;UE functional use;Scar mobility;FMC;Sensation;GMC;Edema;Pain;Strength;Fascial restriction;Coordination    Plan  P: begin wrist strengthening with low level hand weight. Follow up on MD appointment. Attempt muscle energy technique to increase flexion and extension of wrist. Add handgripper activity for grip strengthening.    Consulted and Agree with Plan of Care  Patient       Patient will benefit from skilled therapeutic intervention in order to improve the following deficits and impairments:   Body Structure / Function / Physical Skills: ADL, Decreased knowledge of precautions, Flexibility, ROM, UE functional use, Scar mobility, FMC, Sensation, GMC, Edema, Pain, Strength, Fascial restriction, Coordination       Visit Diagnosis: 1. Other symptoms and signs involving the musculoskeletal system   2. Pain in left wrist   3. Stiffness of left wrist, not elsewhere classified       Problem List Patient Active Problem List   Diagnosis Date Noted  . Varicose veins of both lower extremities with complications 75/04/2584  . History of colonic polyps 11/20/2014   Ailene Ravel, OTR/L,CBIS  [phone]  01/05/2019, 12:30 PM  [facility] [address] [place], [place],  [address] Phone: [phone]   Fax:  [phone]  Name: Sharon Hayes MRN: [id number] Date of Birth: 01/18/1961

## 2019-01-12 ENCOUNTER — Ambulatory Visit (HOSPITAL_COMMUNITY): Payer: 59 | Attending: Orthopedic Surgery | Admitting: Occupational Therapy

## 2019-01-12 ENCOUNTER — Encounter (HOSPITAL_COMMUNITY): Payer: Self-pay | Admitting: Occupational Therapy

## 2019-01-12 ENCOUNTER — Telehealth (HOSPITAL_COMMUNITY): Payer: Self-pay | Admitting: Family Medicine

## 2019-01-12 ENCOUNTER — Other Ambulatory Visit: Payer: Self-pay

## 2019-01-12 DIAGNOSIS — R29898 Other symptoms and signs involving the musculoskeletal system: Secondary | ICD-10-CM | POA: Diagnosis not present

## 2019-01-12 DIAGNOSIS — M25632 Stiffness of left wrist, not elsewhere classified: Secondary | ICD-10-CM | POA: Insufficient documentation

## 2019-01-12 DIAGNOSIS — M25532 Pain in left wrist: Secondary | ICD-10-CM | POA: Diagnosis present

## 2019-01-12 NOTE — Telephone Encounter (Signed)
01/12/19  pt asked that this appt be cx she said they were going out of town

## 2019-01-12 NOTE — Therapy (Signed)
[place], [place], [address] Phone: [phone]   Fax:  [phone]  Occupational Therapy Treatment  Patient Details  Name: Sharon Hayes MRN: [id number] Date of Birth: [birth year] Referring Provider (OT): Dr. Roseanne Kaufman   Encounter Date: 01/12/2019  OT End of Session - 01/12/19 1055    Visit Number  4    Number of Visits  6    Date for OT Re-Evaluation  01/31/19    Authorization Type  [employer]    Authorization Time Period  60 visit limit PT/OT combined. 0 used    Authorization - Visit Number  4    Authorization - Number of Visits  76    OT Start Time  1015    OT Stop Time  1053    OT Time Calculation (min)  38 min    Activity Tolerance  Patient tolerated treatment well    Behavior During Therapy  WFL for tasks assessed/performed       Past Medical History:  Diagnosis Date  . Colon polyps   . Complication of anesthesia    Hard to wake up.  . Varicose veins     Past Surgical History:  Procedure Laterality Date  . COLONOSCOPY  11/24/07   PPJ:KDTOIZ polyp at 15 cm anal verge otherwise normal  . COLONOSCOPY N/A 01/29/2015   Procedure: COLONOSCOPY;  Surgeon: Daneil Dolin, MD;  [facility];  Service: Endoscopy;  Laterality: N/A;  0730  . ENDOVENOUS ABLATION SAPHENOUS VEIN W/ LASER Left 05-17-2015   endovenous laser ablation left greater saphenous vein and stab phlebectomy 10-20 incisions left leg by Curt Jews MD   . ORIF WRIST FRACTURE Left 11/09/2018   Procedure: OPEN REDUCTION INTERNAL FIXATION (ORIF) WRIST FRACTURE;  Surgeon: Roseanne Kaufman, MD;  Location: [facility];  Service: Orthopedics;  Laterality: Left;  . PELVIC LAPAROSCOPY      There were no vitals filed for this visit.  Subjective Assessment - 01/12/19 1015    Subjective   S: Flexion is stil the toughest movement.    Currently in Pain?  No/denies         [facility] OT Assessment - 01/12/19 1014      Assessment   Medical Diagnosis  left distal radius fracture      Precautions   Precautions  Other (comment)    Precaution Comments  progress to strengthening as tolerated               OT Treatments/Exercises (OP) - 01/12/19 1015      Exercises   Exercises  Wrist;Hand;Theraputty      Wrist Exercises   Wrist Flexion  PROM;10 reps;Strengthening;15 reps    Bar Weights/Barbell (Wrist Flexion)  2 lbs    Wrist Extension  PROM;10 reps;Strengthening;15 reps    Bar Weights/Barbell (Wrist Extension)  2 lbs    Wrist Radial Deviation  PROM;5 reps;Strengthening;15 reps    Bar Weights/Barbell (Radial Deviation)  2 lbs    Wrist Ulnar Deviation  PROM;5 reps;Strengthening;15 reps    Bar Weights/Barbell (Ulnar Deviation)  2 lbs    Other wrist exercises  Pt used red and green clothespins to stack piles of 5 sponges using 3 point pinch. Pt stacked 2 pile with red and 2 piles with green. Mod difficulty with green, min with red.       Additional Wrist Exercises   Hand Gripper with Large Beads  all beads gripper at 25#    Hand Gripper  with Medium Beads  all beads gripper at 25#    Hand Gripper with Small Beads  all beads gripper at 20#      Manual Therapy   Manual Therapy  Myofascial release    Manual therapy comments  Manual therapy completed prior to exercises.    Myofascial Release  Myofascial release and manual stretching completed to left forearm and wrist to decrease fascial restrictions and increase joint mobility in a pain free zone.              OT Education - 01/12/19 1055    Education Details  strengthening with 2# weights-flexion/extension/radial and ulnar deviation    Person(s) Educated  Patient    Methods  Explanation;Demonstration;Handout    Comprehension  Verbalized understanding;Returned demonstration       OT Short Term Goals - 12/29/18 0855      OT SHORT TERM GOAL #1   Title  patient will be educated and independent with HEP to increase functional performance during ADL  tasks and allow her to return to work at her highest level of independence.     Time  6    Period  Weeks    Status  On-going    Target Date  01/31/19      OT SHORT TERM GOAL #2   Title  Patient will increase left wrist A/ROM to WNL to increase ability to complete manual techniques at work requiring full wrist extension and flexion.    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     Time  6    Period  Weeks    Status  On-going      OT SHORT TERM GOAL #4   Title  Patient will decrease fascial restrictions and edema to trace amount in her left wrist in order to increase functional mobility needed to complete daily tasks.    Time  6    Period  Weeks    Status  On-going      OT SHORT TERM GOAL #5   Title  Patient will decrease pain level in left wrist to 2/10 or less with activity when completing tasks such as lifting pots and pans or items in the kitchen.    Time  6    Period  Weeks    Status  On-going               Plan - 01/12/19 1056    Clinical Impression Statement  A: Pt reports MD is pleased with her progress. Pt feels like her ROM has hit a plateau, flexion is still the toughest movement. Continued with P/ROM for flexion/extension/radial and ulnar deviation, pt has full range in supination. Initiated wrist strengthening and grip strengthening this session. Occasional rest breaks during gripper task. Attempted green clothespin pinch task, pt with mod difficulty. No pain during session.    Body Structure / Function / Physical Skills  ADL;Decreased knowledge of precautions;Flexibility;ROM;UE functional use;Scar mobility;FMC;Sensation;GMC;Edema;Pain;Strength;Fascial restriction;Coordination    Plan  P: Follow up on strengthening HEP. Add wrist flexion stretch, continue with grip strengthening and resume weighted  flexion stretch attempting 3# weight    Consulted and Agree with Plan of Care  Patient       Patient will benefit from skilled  therapeutic intervention in order to improve the following deficits and impairments:   Body Structure / Function / Physical Skills: ADL, Decreased knowledge of precautions, Flexibility, ROM, UE functional use, Scar mobility, FMC, Sensation, GMC, Edema, Pain, Strength, Fascial restriction, Coordination       Visit Diagnosis: 1. Other symptoms and signs involving the musculoskeletal system   2. Pain in left wrist   3. Stiffness of left wrist, not elsewhere classified       Problem List Patient Active Problem List   Diagnosis Date Noted  . Varicose veins of both lower extremities with complications 17/98/1025  . History of colonic polyps 11/20/2014   Guadelupe Sabin, OTR/L  [phone] 01/12/2019, 10:59 AM  [facility] [address] [place], [place], [address] Phone: [phone]   Fax:  [phone]  Name: Sharon Hayes MRN: [id number] Date of Birth: Jul 16, 1960

## 2019-01-12 NOTE — Patient Instructions (Addendum)
Strengthening Exercises:  *Complete exercises using _2___ pound weight, _15___times each, __2-3__times per day*  1) WRIST EXTENSION CURLS - TABLE  Hold a small free weight, rest your forearm on a table and bend your wrist up and down with your palm face down as shown.      2) WRIST FLEXION CURLS - TABLE  Hold a small free weight, rest your forearm on a table and bend your wrist up and down with your palm face up as shown.     3) FREE WEIGHT RADIAL/ULNAR DEVIATION - TABLE  Hold a small free weight, rest your forearm on a table and bend your wrist up and down with your palm facing towards the side as shown.

## 2019-01-21 ENCOUNTER — Encounter (HOSPITAL_COMMUNITY): Payer: 59

## 2019-01-26 ENCOUNTER — Other Ambulatory Visit (HOSPITAL_COMMUNITY): Payer: Self-pay | Admitting: Family Medicine

## 2019-01-26 DIAGNOSIS — Z1231 Encounter for screening mammogram for malignant neoplasm of breast: Secondary | ICD-10-CM

## 2019-01-31 ENCOUNTER — Other Ambulatory Visit: Payer: Self-pay

## 2019-01-31 ENCOUNTER — Ambulatory Visit (HOSPITAL_COMMUNITY): Payer: 59 | Admitting: Specialist

## 2019-01-31 ENCOUNTER — Encounter (HOSPITAL_COMMUNITY): Payer: Self-pay | Admitting: Specialist

## 2019-01-31 DIAGNOSIS — R29898 Other symptoms and signs involving the musculoskeletal system: Secondary | ICD-10-CM | POA: Diagnosis not present

## 2019-01-31 DIAGNOSIS — M25632 Stiffness of left wrist, not elsewhere classified: Secondary | ICD-10-CM

## 2019-01-31 DIAGNOSIS — M25532 Pain in left wrist: Secondary | ICD-10-CM

## 2019-01-31 NOTE — Therapy (Signed)
[facility] [address] [place], [place], [address] Phone: [phone]   Fax:  [phone]  Occupational Therapy Treatment  Patient Details  Name: Sharon Hayes MRN: [id number] Date of Birth: 05/03/1961 Referring Provider (OT): Dr. William Gramig   Encounter Date: 01/31/2019  OT End of Session - 01/31/19 1331    Visit Number  5    Number of Visits  6    Date for OT Re-Evaluation  01/31/19    Authorization Type  [employer]    Authorization Time Period  60 visit limit PT/OT combined. 0 used    Authorization - Visit Number  5    Authorization - Number of Visits  60    OT Start Time  1130    OT Stop Time  1158    OT Time Calculation (min)  28 min    Activity Tolerance  Patient tolerated treatment well    Behavior During Therapy  WFL for tasks assessed/performed       Past Medical History:  Diagnosis Date  . Colon polyps   . Complication of anesthesia    Hard to wake up.  . Varicose veins     Past Surgical History:  Procedure Laterality Date  . COLONOSCOPY  11/24/07   RMR:rectal polyp at 15 cm anal verge otherwise normal  . COLONOSCOPY N/A 01/29/2015   Procedure: COLONOSCOPY;  Surgeon: Robert M Rourk, MD;  [facility];  Service: Endoscopy;  Laterality: N/A;  0730  . ENDOVENOUS ABLATION SAPHENOUS VEIN W/ LASER Left 05-17-2015   endovenous laser ablation left greater saphenous vein and stab phlebectomy 10-20 incisions left leg by Todd Early MD   . ORIF WRIST FRACTURE Left 11/09/2018   Procedure: OPEN REDUCTION INTERNAL FIXATION (ORIF) WRIST FRACTURE;  Surgeon: Gramig, William, MD;  [facility];  Service: Orthopedics;  Laterality: Left;  . PELVIC LAPAROSCOPY      There were no vitals filed for this visit.  Subjective Assessment - 01/31/19 1329    Subjective   S:  Sometimes I can tell its not the same when I am lifting something heavy, like pots and pans.  Other than that, it is doing really well.     Currently in Pain?  No/denies         [facility] OT Assessment - 01/31/19 0001      Assessment   Medical Diagnosis  left distal radius fracture    Referring Provider (OT)  Dr. William Gramig      ADL   ADL comments  lifting pots and pans is difficult, can tell full strength is not back, however strength is improving quickly      Coordination   9 Hole Peg Test  Right;Left    Left 9 Hole Peg Test  21.32" (25.9")      Edema   Edema  left wrist crease 16.5 cm (17.0 cm)      ROM / Strength   AROM / PROM / Strength  Strength      Palpation   Palpation comment  trace fascial restrictions      AROM   Left Forearm Pronation  90 Degrees    Left Forearm Supination  90 Degrees    Left Wrist Extension  66 Degrees   60   Left Wrist Flexion  60 Degrees   54   Left Wrist Radial Deviation  32 Degrees   32   Left Wrist Ulnar Deviation  24 Degrees   20       PROM   Overall PROM Comments  wfl      Strength   Strength Assessment Site  Wrist;Forearm    Right/Left Forearm  Left    Left Forearm Pronation  5/5    Left Forearm Supination  5/5    Right/Left Wrist  Left    Left Wrist Flexion  5/5    Left Wrist Extension  5/5    Left Wrist Radial Deviation  5/5    Left Wrist Ulnar Deviation  5/5    Left Hand Grip (lbs)  56   30   Left Hand Lateral Pinch  12 lbs   9   Left Hand 3 Point Pinch  10 lbs   6              OT Treatments/Exercises (OP) - 01/31/19 0001      Manual Therapy   Manual Therapy  Myofascial release    Manual therapy comments  Manual therapy completed prior to exercises.    Myofascial Release  Myofascial release and manual stretching completed to left forearm and wrist to decrease fascial restrictions and increase joint mobility in a pain free zone.              OT Education - 01/31/19 1330    Education Details  encouraged patient to continue using arm for all normal activities, as this will continue to build strength, updated HEP to mod resist green  theraputty    Person(s) Educated  Patient    Methods  Explanation;Demonstration;Handout    Comprehension  Verbalized understanding;Returned demonstration       OT Short Term Goals - 01/31/19 1337      OT SHORT TERM GOAL #1   Title  patient will be educated and independent with HEP to increase functional performance during ADL tasks and allow her to return to work at her highest level of independence.     Time  6    Period  Weeks    Status  Achieved    Target Date  01/31/19      OT SHORT TERM GOAL #2   Title  Patient will increase left wrist A/ROM to WNL to increase ability to complete manual techniques at work requiring full wrist extension and flexion.    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     Time  6    Period  Weeks    Status  Achieved      OT SHORT TERM GOAL #4   Title  Patient will decrease fascial restrictions and edema to trace amount in her left wrist in order to increase functional mobility needed to complete daily tasks.    Time  6    Period  Weeks    Status  Partially Met      OT SHORT TERM GOAL #5   Title  Patient will decrease pain level in left wrist to 2/10 or less with activity when completing tasks such as lifting pots and pans or items in the kitchen.    Time  6    Period  Weeks    Status  Achieved               Plan - 01/31/19 1331    Clinical Impression Statement    A: Patient has met or made progress towards all goal.  A/ROM, wrist strength, and FMC are now WNL.  Grip and pinch strength and edema are [facility] and patient will continue to work towards WNL with HEP.    Body Structure / Function / Physical Skills  ADL;Decreased knowledge of precautions;Flexibility;ROM;UE functional use;Scar mobility;FMC;Sensation;GMC;Edema;Pain;Strength;Fascial restriction;Coordination    Plan  P: DC  from skilled OT intervention this date.  Patient to continue working on grip/pinch strength with HEP.    Consulted and Agree with Plan of Care  Patient       Patient will benefit from skilled therapeutic intervention in order to improve the following deficits and impairments:   Body Structure / Function / Physical Skills: ADL, Decreased knowledge of precautions, Flexibility, ROM, UE functional use, Scar mobility, FMC, Sensation, GMC, Edema, Pain, Strength, Fascial restriction, Coordination       Visit Diagnosis: 1. Other symptoms and signs involving the musculoskeletal system   2. Pain in left wrist   3. Stiffness of left wrist, not elsewhere classified       Problem List Patient Active Problem List   Diagnosis Date Noted  . Varicose veins of both lower extremities with complications 25/00/3704  . History of colonic polyps 11/20/2014    Vangie Bicker, [place], OTR/L [phone]  01/31/2019, 1:44 PM OCCUPATIONAL THERAPY DISCHARGE SUMMARY  Visits from Start of Care: 5 Current functional level related to goals / functional outcomes: Some difficulty with heavy lifting, otherwise able to complete all desired daily activities.  Planning to return to work next week.    Remaining deficits: Grip/pinch strength, edema   Education / Equipment: Continued grip/pinch strengthening Plan: Patient agrees to discharge.  Patient goals were partially met. Patient is being discharged due to meeting the stated rehab goals.  ?????    Vangie Bicker, [place], OTR/L [place] [address] [place], [place], [address] Phone: [phone]   Fax:  [phone]  Name: Sharon Hayes MRN: [id number] Date of Birth: 1961/02/01

## 2019-02-02 ENCOUNTER — Encounter (HOSPITAL_COMMUNITY): Payer: 59

## 2019-02-14 ENCOUNTER — Ambulatory Visit (HOSPITAL_COMMUNITY)
Admission: RE | Admit: 2019-02-14 | Discharge: 2019-02-14 | Disposition: A | Discharge status: Home / Self Care | Payer: 59 | Source: Ambulatory Visit | Attending: Family Medicine | Admitting: Family Medicine

## 2019-02-14 ENCOUNTER — Other Ambulatory Visit: Payer: Self-pay

## 2019-02-14 DIAGNOSIS — Z1231 Encounter for screening mammogram for malignant neoplasm of breast: Secondary | ICD-10-CM | POA: Diagnosis present

## 2019-04-04 DIAGNOSIS — E7800 Pure hypercholesterolemia, unspecified: Secondary | ICD-10-CM | POA: Insufficient documentation

## 2019-05-30 ENCOUNTER — Telehealth: Payer: Self-pay | Admitting: *Deleted

## 2019-05-30 NOTE — Telephone Encounter (Signed)
A message was left, re: her new patient appointment. 

## 2019-06-01 ENCOUNTER — Telehealth: Payer: Self-pay

## 2019-06-01 NOTE — Telephone Encounter (Signed)
NOTES ON FILE FROM [facility] [phone], REFERRAL SENT TO SCHEDULING 

## 2019-06-06 ENCOUNTER — Other Ambulatory Visit (HOSPITAL_COMMUNITY): Payer: Self-pay | Admitting: Physician Assistant

## 2019-06-06 ENCOUNTER — Ambulatory Visit (HOSPITAL_COMMUNITY)
Admission: RE | Admit: 2019-06-06 | Discharge: 2019-06-06 | Disposition: A | Discharge status: Home / Self Care | Payer: 59 | Source: Ambulatory Visit | Attending: Physician Assistant | Admitting: Physician Assistant

## 2019-06-06 ENCOUNTER — Other Ambulatory Visit: Payer: Self-pay

## 2019-06-06 DIAGNOSIS — R0789 Other chest pain: Secondary | ICD-10-CM | POA: Diagnosis present

## 2019-06-21 ENCOUNTER — Ambulatory Visit: Payer: 59 | Admitting: Cardiology

## 2019-07-13 ENCOUNTER — Encounter: Payer: Self-pay | Admitting: Cardiovascular Disease

## 2019-07-13 ENCOUNTER — Ambulatory Visit (INDEPENDENT_AMBULATORY_CARE_PROVIDER_SITE_OTHER): Payer: 59 | Admitting: Cardiovascular Disease

## 2019-07-13 ENCOUNTER — Other Ambulatory Visit: Payer: Self-pay

## 2019-07-13 DIAGNOSIS — R0789 Other chest pain: Secondary | ICD-10-CM | POA: Diagnosis not present

## 2019-07-13 DIAGNOSIS — R072 Precordial pain: Secondary | ICD-10-CM | POA: Diagnosis not present

## 2019-07-13 DIAGNOSIS — Z8249 Family history of ischemic heart disease and other diseases of the circulatory system: Secondary | ICD-10-CM | POA: Diagnosis not present

## 2019-07-13 NOTE — Patient Instructions (Signed)
Medication Instructions:  Your physician recommends that you continue on your current medications as directed. Please refer to the Current Medication list given to you today.  If you need a refill on your cardiac medications before your next appointment, please call your pharmacy.   Lab work: NONE  Testing/Procedures: Coronary Calcium Score  Follow-Up: At [facility], you and your health needs are our priority.  As part of our continuing mission to provide you with exceptional heart care, we have created designated Provider Care Teams.  These Care Teams include your primary Cardiologist (physician) and Advanced Practice Providers (APPs -  Physician Assistants and Nurse Practitioners) who all work together to provide you with the care you need, when you need it. You may see Dr Berry or one of the following Advanced Practice Providers on your designated Care Team:    Luke Kilroy, PA-C  Callie Goodrich, PA-C  Jesse Cleaver, FNP  Your physician wants you to follow-up as needed.  Any Other Special Instructions Will Be Listed Below (If Applicable).   Coronary Calcium Scan A coronary calcium scan is an imaging test used to look for deposits of calcium and other fatty materials (plaques) in the inner lining of the blood vessels of the heart (coronary arteries). These deposits of calcium and plaques can partly clog and narrow the coronary arteries without producing any symptoms or warning signs. This puts a person at risk for a heart attack. This test can detect these deposits before symptoms develop. Tell a health care provider about:  Any allergies you have.  All medicines you are taking, including vitamins, herbs, eye drops, creams, and over-the-counter medicines.  Any problems you or family members have had with anesthetic medicines.  Any blood disorders you have.  Any surgeries you have had.  Any medical conditions you have.  Whether you are pregnant or may be pregnant. What  are the risks? Generally, this is a safe procedure. However, problems may occur, including:  Harm to a pregnant woman and her unborn baby. This test involves the use of radiation. Radiation exposure can be dangerous to a pregnant woman and her unborn baby. If you are pregnant, you generally should not have this procedure done.  Slight increase in the risk of cancer. This is because of the radiation involved in the test. What happens before the procedure? No preparation is needed for this procedure. What happens during the procedure?   You will undress and remove any jewelry around your neck or chest.  You will put on a hospital gown.  Sticky electrodes will be placed on your chest. The electrodes will be connected to an electrocardiogram (ECG) machine to record a tracing of the electrical activity of your heart.  A CT scanner will take pictures of your heart. During this time, you will be asked to lie still and hold your breath for 2-3 seconds while a picture of your heart is being taken. The procedure may vary among health care providers and hospitals. What happens after the procedure?  You can get dressed.  You can return to your normal activities.  It is up to you to get the results of your test. Ask your health care provider, or the department that is doing the test, when your results will be ready. Summary  A coronary calcium scan is an imaging test used to look for deposits of calcium and other fatty materials (plaques) in the inner lining of the blood vessels of the heart (coronary arteries).  Generally, this is   a safe procedure. Tell your health care provider if you are pregnant or may be pregnant.  No preparation is needed for this procedure.  A CT scanner will take pictures of your heart.  You can return to your normal activities after the scan is done. This information is not intended to replace advice given to you by your health care provider. Make sure you discuss  any questions you have with your health care provider. Document Released: 12/27/2007 Document Revised: 06/12/2017 Document Reviewed: 05/19/2016 Elsevier Patient Education  2020 [employer]       

## 2019-07-13 NOTE — Assessment & Plan Note (Signed)
Sharon Hayes was referred to me by Clyde Lundborg PA-C at [facility] family practice for evaluation of atypical chest pain.  Her only risk factor is mild untreated hyperlipidemia and family history with a father who had his first myocardial infarction at age 58.  Her first episode of chest pain was in March and the most recent one was in November.  She has had milder episodes in between that occur every week or 2 lasting several minutes at a time without radiation.  Nothing in particular brings them on.  I am to get a coronary calcium score to further evaluate

## 2019-07-13 NOTE — Progress Notes (Signed)
07/13/2019 Sharon Hayes   23-Sep-1960  [id number]  Primary Physician Sharon Shire, MD Primary Cardiologist: Sharon Harp MD Sharon Hayes, [place]  HPI:  Sharon Hayes is a 58 y.o. 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  She was referred by Sharon Lundborg, PA-C at [facility] family practice for atypical chest pain.  She has a history of hyperlipidemia untreated which apparently is mild as well as family history the father had a myocardial infarction at age 88.  She has no other cardiac risk factors.  She walks daily.  Her first episode of chest pain was in March in the second 1 was on [place] in November lasting 2 or 3 minutes and resolving spontaneously without other associated symptoms.  She has much less severe episodes occurring every 1 to 2 weeks.  These episodes did not occur while she is walking.   Current Meds  Medication Sig  . [DISCONTINUED] Ascorbic Acid (VITAMIN C) 1000 MG tablet Take 1,000 mg by mouth daily.  . [DISCONTINUED] methocarbamol (ROBAXIN) 500 MG tablet Take 1 tablet (500 mg total) by mouth every 6 (six) hours as needed for muscle spasms.  . [DISCONTINUED] ondansetron (ZOFRAN) 4 MG tablet Take 1 tablet (4 mg total) by mouth every 8 (eight) hours as needed for nausea or vomiting.  . [DISCONTINUED] oxyCODONE (ROXICODONE) 5 MG immediate release tablet Take 1 tablet (5 mg total) by mouth every 4 (four) hours as needed.     No Known Allergies  Social History   Socioeconomic History  . Marital status: Married    Spouse name: Not on file  . Number of children: Not on file  . Years of education: Not on file  . Highest education level: Not on file  Occupational History  . Not on file  Tobacco Use  . Smoking status: Never Smoker  . Smokeless tobacco: Never Used  Substance and Sexual Activity  . Alcohol use: Yes    Alcohol/week: 9.0 standard drinks    Types: 7  Glasses of wine, 2 Shots of liquor per week    Comment: 1-2 glasses of wine approx 4 days a week  . Drug use: No  . Sexual activity: Yes    Birth control/protection: Post-menopausal  Other Topics Concern  . Not on file  Social History Narrative  . Not on file   Social Determinants of Health   Financial Resource Strain:   . Difficulty of Paying Living Expenses: Not on file  Food Insecurity:   . Worried About Charity fundraiser in the Last Year: Not on file  . Ran Out of Food in the Last Year: Not on file  Transportation Needs:   . Lack of Transportation (Medical): Not on file  . Lack of Transportation (Non-Medical): Not on file  Physical Activity:   . Days of Exercise per Week: Not on file  . Minutes of Exercise per Session: Not on file  Stress:   . Feeling of Stress : Not on file  Social Connections:   . Frequency of Communication with Friends and Family: Not on file  . Frequency of Social Gatherings with Friends and Family: Not on file  . Attends Religious Services: Not on file  . Active Member of Clubs or Organizations: Not on file  . Attends Archivist Meetings: Not on file  . Marital Status: Not on file  Intimate Partner Violence:   .  Fear of Current or Ex-Partner: Not on file  . Emotionally Abused: Not on file  . Physically Abused: Not on file  . Sexually Abused: Not on file     Review of Systems: General: negative for chills, fever, night sweats or weight changes.  Cardiovascular: negative for chest pain, dyspnea on exertion, edema, orthopnea, palpitations, paroxysmal nocturnal dyspnea or shortness of breath Dermatological: negative for rash Respiratory: negative for cough or wheezing Urologic: negative for hematuria Abdominal: negative for nausea, vomiting, diarrhea, bright red blood per rectum, melena, or hematemesis Neurologic: negative for visual changes, syncope, or dizziness All other systems reviewed and are otherwise negative except as noted  above.    Blood pressure 109/70, pulse (!) 50, temperature 97.7 F (36.5 C), height 5\' 7"  (1.702 m), weight 163 lb (73.9 kg).  General appearance: alert and no distress Neck: no adenopathy, no carotid bruit, no JVD, supple, symmetrical, trachea midline and thyroid not enlarged, symmetric, no tenderness/mass/nodules Lungs: clear to auscultation bilaterally Heart: regular rate and rhythm, S1, S2 normal, no murmur, click, rub or gallop Extremities: extremities normal, atraumatic, no cyanosis or edema Pulses: 2+ and symmetric Skin: Skin color, texture, turgor normal. No rashes or lesions Neurologic: Alert and oriented X 3, normal strength and tone. Normal symmetric reflexes. Normal coordination and gait  EKG sinus bradycardia at 50 without ST or T wave changes.  Personally reviewed this EKG.  ASSESSMENT AND PLAN:   Atypical chest pain Sharon Hayes was referred to me by Sharon Lundborg PA-C at [facility] family practice for evaluation of atypical chest pain.  Her only risk factor is mild untreated hyperlipidemia and family history with a father who had his first myocardial infarction at age 53.  Her first episode of chest pain was in March and the most recent one was in November.  She has had milder episodes in between that occur every week or 2 lasting several minutes at a time without radiation.  Nothing in particular brings them on.  I am to get a coronary calcium score to further evaluate  Family history of heart disease Her father had his first myocardial infarction at age 97      Sharon Harp MD Sharon Hayes, [facility] 07/13/2019 4:21 PM

## 2019-07-13 NOTE — Assessment & Plan Note (Signed)
Her father had his first myocardial infarction at age 58

## 2019-07-25 ENCOUNTER — Inpatient Hospital Stay: Admission: RE | Admit: 2019-07-25 | Payer: 59 | Source: Ambulatory Visit

## 2019-08-01 ENCOUNTER — Inpatient Hospital Stay: Admission: RE | Admit: 2019-08-01 | Payer: 59 | Source: Ambulatory Visit

## 2019-09-05 ENCOUNTER — Other Ambulatory Visit: Payer: Self-pay

## 2019-09-05 ENCOUNTER — Ambulatory Visit (INDEPENDENT_AMBULATORY_CARE_PROVIDER_SITE_OTHER)
Admission: RE | Admit: 2019-09-05 | Discharge: 2019-09-05 | Disposition: A | Discharge status: Home / Self Care | Payer: Self-pay | Source: Ambulatory Visit | Attending: Cardiology | Admitting: Cardiology

## 2019-09-05 DIAGNOSIS — R072 Precordial pain: Secondary | ICD-10-CM

## 2019-10-04 ENCOUNTER — Telehealth: Payer: 59 | Admitting: Physician Assistant

## 2019-10-04 DIAGNOSIS — N390 Urinary tract infection, site not specified: Secondary | ICD-10-CM

## 2019-10-04 MED ORDER — CEPHALEXIN 500 MG PO CAPS: ORAL_CAPSULE | ORAL | 0 refills | Status: DC

## 2019-10-04 NOTE — Progress Notes (Signed)

## 2019-12-28 ENCOUNTER — Encounter: Payer: Self-pay | Admitting: Internal Medicine

## 2020-03-02 ENCOUNTER — Other Ambulatory Visit (HOSPITAL_COMMUNITY): Payer: Self-pay | Admitting: Family Medicine

## 2020-03-02 DIAGNOSIS — Z1231 Encounter for screening mammogram for malignant neoplasm of breast: Secondary | ICD-10-CM

## 2020-03-26 ENCOUNTER — Other Ambulatory Visit: Payer: Self-pay

## 2020-03-26 ENCOUNTER — Ambulatory Visit (HOSPITAL_COMMUNITY)
Admission: RE | Admit: 2020-03-26 | Discharge: 2020-03-26 | Disposition: A | Discharge status: Home / Self Care | Payer: 59 | Source: Ambulatory Visit | Attending: Family Medicine | Admitting: Family Medicine

## 2020-03-26 DIAGNOSIS — Z1231 Encounter for screening mammogram for malignant neoplasm of breast: Secondary | ICD-10-CM | POA: Insufficient documentation

## 2020-03-27 ENCOUNTER — Ambulatory Visit (INDEPENDENT_AMBULATORY_CARE_PROVIDER_SITE_OTHER): Payer: 59 | Admitting: Cardiovascular Disease

## 2020-03-27 ENCOUNTER — Encounter: Payer: Self-pay | Admitting: Cardiovascular Disease

## 2020-03-27 VITALS — BP 104/74 | HR 48 | Ht 67.0 in | Wt 163.8 lb

## 2020-03-27 DIAGNOSIS — R0789 Other chest pain: Secondary | ICD-10-CM | POA: Diagnosis not present

## 2020-03-27 DIAGNOSIS — R079 Chest pain, unspecified: Secondary | ICD-10-CM | POA: Diagnosis not present

## 2020-03-27 NOTE — Progress Notes (Signed)
03/27/2020 Sharon Hayes   Sep 21, 1960  [id number]  Primary Physician Stephens Shire, MD Primary Cardiologist: Lorretta Harp MD Lupe Carney, [place]  HPI:  Sharon Hayes is a 58 y.o.   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  She was referred by Clyde Lundborg, PA-C at [facility] family practice for atypical chest pain.  I last saw her in the office 07/13/2019. She has a history of hyperlipidemia untreated which apparently is mild as well as family history the father had a myocardial infarction at age 71.  She has no other cardiac risk factors.  She walks daily.  Her first episode of chest pain was in March in the second 1 was on [place] in November lasting 2 or 3 minutes and resolving spontaneously without other associated symptoms.  She has much less severe episodes occurring every 1 to 2 weeks.  These episodes did not occur while she is walking.  I performed a coronary calcium score on her 09/05/2019 which was 9 suggesting that she had minimal calcified plaque.  She continues to have chest pain however.  She has had 1 or 2 severe episodes in multiple less severe episodes.  Based on this, I am going to proceed with coronary CTA to rule out soft plaque as the etiology.    No outpatient medications have been marked as taking for the 03/27/20 encounter (Office Visit) with Lorretta Harp, MD.     No Known Allergies  Social History   Socioeconomic History  . Marital status: Married    Spouse name: Not on file  . Number of children: Not on file  . Years of education: Not on file  . Highest education level: Not on file  Occupational History  . Not on file  Tobacco Use  . Smoking status: Never Smoker  . Smokeless tobacco: Never Used  Vaping Use  . Vaping Use: Never used  Substance and Sexual Activity  . Alcohol use: Yes    Alcohol/week: 9.0 standard drinks    Types: 7 Glasses of wine,  2 Shots of liquor per week    Comment: 1-2 glasses of wine approx 4 days a week  . Drug use: No  . Sexual activity: Yes    Birth control/protection: Post-menopausal  Other Topics Concern  . Not on file  Social History Narrative  . Not on file   Social Determinants of Health   Financial Resource Strain:   . Difficulty of Paying Living Expenses: Not on file  Food Insecurity:   . Worried About Charity fundraiser in the Last Year: Not on file  . Ran Out of Food in the Last Year: Not on file  Transportation Needs:   . Lack of Transportation (Medical): Not on file  . Lack of Transportation (Non-Medical): Not on file  Physical Activity:   . Days of Exercise per Week: Not on file  . Minutes of Exercise per Session: Not on file  Stress:   . Feeling of Stress : Not on file  Social Connections:   . Frequency of Communication with Friends and Family: Not on file  . Frequency of Social Gatherings with Friends and Family: Not on file  . Attends Religious Services: Not on file  . Active Member of Clubs or Organizations: Not on file  . Attends Archivist Meetings: Not on file  . Marital Status: Not on file  Intimate Partner Violence:   . Fear of Current or Ex-Partner: Not on file  . Emotionally Abused: Not on file  . Physically Abused: Not on file  . Sexually Abused: Not on file     Review of Systems: General: negative for chills, fever, night sweats or weight changes.  Cardiovascular: negative for chest pain, dyspnea on exertion, edema, orthopnea, palpitations, paroxysmal nocturnal dyspnea or shortness of breath Dermatological: negative for rash Respiratory: negative for cough or wheezing Urologic: negative for hematuria Abdominal: negative for nausea, vomiting, diarrhea, bright red blood per rectum, melena, or hematemesis Neurologic: negative for visual changes, syncope, or dizziness All other systems reviewed and are otherwise negative except as noted above.    Blood  pressure 104/74, pulse (!) 48, height 5\' 7"  (1.702 m), weight 163 lb 12.8 oz (74.3 kg), SpO2 99 %.  General appearance: alert and no distress Neck: no adenopathy, no carotid bruit, no JVD, supple, symmetrical, trachea midline and thyroid not enlarged, symmetric, no tenderness/mass/nodules Lungs: clear to auscultation bilaterally Heart: regular rate and rhythm, S1, S2 normal, no murmur, click, rub or gallop Extremities: extremities normal, atraumatic, no cyanosis or edema Pulses: 2+ and symmetric Skin: Skin color, texture, turgor normal. No rashes or lesions Neurologic: Alert and oriented X 3, normal strength and tone. Normal symmetric reflexes. Normal coordination and gait  EKG sinus bradycardia 48 without ST or T wave changes.  Personally reviewed this EKG.  ASSESSMENT AND PLAN:   Atypical chest pain History of atypical chest pain with coronary calcium score performed 09/05/2019 which was 9 suggesting that she had minimal calcified plaque.  She does have a premature family history of heart disease.  I am going to get a coronary CTA to further evaluate.      Lorretta Harp MD FACP,FACC,FAHA, [facility] 03/27/2020 10:17 AM

## 2020-03-27 NOTE — Patient Instructions (Signed)
Medication Instructions:  Your physician recommends that you continue on your current medications as directed. Please refer to the Current Medication list given to you today.  *If you need a refill on your cardiac medications before your next appointment, please call your pharmacy*   Lab Work: BMET today  If you have labs (blood work) drawn today and your tests are completely normal, you will receive your results only by: Marland Kitchen MyChart Message (if you have MyChart) OR . A paper copy in the mail If you have any lab test that is abnormal or we need to change your treatment, we will call you to review the results.   Testing/Procedures: Coronary CTA-see instructions  Follow-Up: At [facility], you and your health needs are our priority.  As part of our continuing mission to provide you with exceptional heart care, we have created designated Provider Care Teams.  These Care Teams include your primary Cardiologist (physician) and Advanced Practice Providers (APPs -  Physician Assistants and Nurse Practitioners) who all work together to provide you with the care you need, when you need it.  We recommend signing up for the patient portal called "MyChart".  Sign up information is provided on this After Visit Summary.  MyChart is used to connect with patients for Virtual Visits (Telemedicine).  Patients are able to view lab/test results, encounter notes, upcoming appointments, etc.  Non-urgent messages can be sent to your provider as well.   To learn more about what you can do with MyChart, go to [email or web address].    Your next appointment:   AS NEEDED with Dr. Gwenlyn Found   Other Instructions   Your cardiac CT will be scheduled at one of the below locations:   [facility] [address] [place], [place] [address] [phone]  [place] [address] [place], [place] [address] [phone]  If scheduled at [facility], please arrive at the [facility] main entrance of [facility] 30 minutes prior to test start time. Proceed to the [facility] Radiology Department (first floor) to check-in and test prep.  If scheduled at [facility], please arrive 15 mins early for check-in and test prep.  Please follow these instructions carefully (unless otherwise directed):  Hold all erectile dysfunction medications at least 3 days (72 hrs) prior to test.  On the Night Before the Test: . Be sure to Drink plenty of water. . Do not consume any caffeinated/decaffeinated beverages or chocolate 12 hours prior to your test. . Do not take any antihistamines 12 hours prior to your test. .  On the Day of the Test: . Drink plenty of water. Do not drink any water within one hour of the test. . Do not eat any food 4 hours prior to the test. . You may take your regular medications prior to the test.  . FEMALES- please wear underwire-free bra if available       After the Test: . Drink plenty of water. . After receiving IV contrast, you may experience a mild flushed feeling. This is normal. . On occasion, you may experience a mild rash up to 24 hours after the test. This is not dangerous. If this occurs, you can take Benadryl 25 mg and increase your fluid intake. . If you experience trouble breathing, this can be serious. If it is severe call 911 IMMEDIATELY. If it is mild, please call our office. . If you take any of these  medications: Glipizide/Metformin, Avandament, Glucavance, please do not take 48 hours after completing test unless otherwise instructed.   Once we have confirmed authorization from your insurance company, we will call you to set up a date and time for your test. Based on how quickly your insurance processes prior authorizations requests, please allow up to 4 weeks to be contacted for scheduling your Cardiac CT appointment. Be advised that routine Cardiac CT appointments  could be scheduled as many as 8 weeks after your provider has ordered it.  For non-scheduling related questions, please contact the cardiac imaging nurse navigator should you have any questions/concerns: Marchia Bond, Cardiac Imaging Nurse Navigator Burley Saver, Interim Cardiac Imaging Nurse [facility] Direct Office Dial: [phone]   For scheduling needs, including cancellations and rescheduling, please call Vivien Rota at [phone], option 3.

## 2020-03-27 NOTE — Assessment & Plan Note (Signed)
History of atypical chest pain with coronary calcium score performed 09/05/2019 which was 9 suggesting that she had minimal calcified plaque.  She does have a premature family history of heart disease.  I am going to get a coronary CTA to further evaluate.

## 2020-05-06 ENCOUNTER — Telehealth: Payer: 59 | Admitting: Nurse Practitioner

## 2020-05-06 DIAGNOSIS — N3000 Acute cystitis without hematuria: Secondary | ICD-10-CM

## 2020-05-06 MED ORDER — CEPHALEXIN 500 MG PO CAPS: 500.0000 mg | ORAL_CAPSULE | Freq: Two times a day (BID) | ORAL | 0 refills | Status: DC

## 2020-05-06 NOTE — Progress Notes (Signed)

## 2020-05-22 IMAGING — DX LEFT FOREARM - 2 VIEW
3 series · 3 of 3 positions shown · non-contrast
Comparison: None.

CLINICAL DATA: Left forearm pain after fall.

EXAM:
LEFT FOREARM - 2 VIEW

[forearm ap]
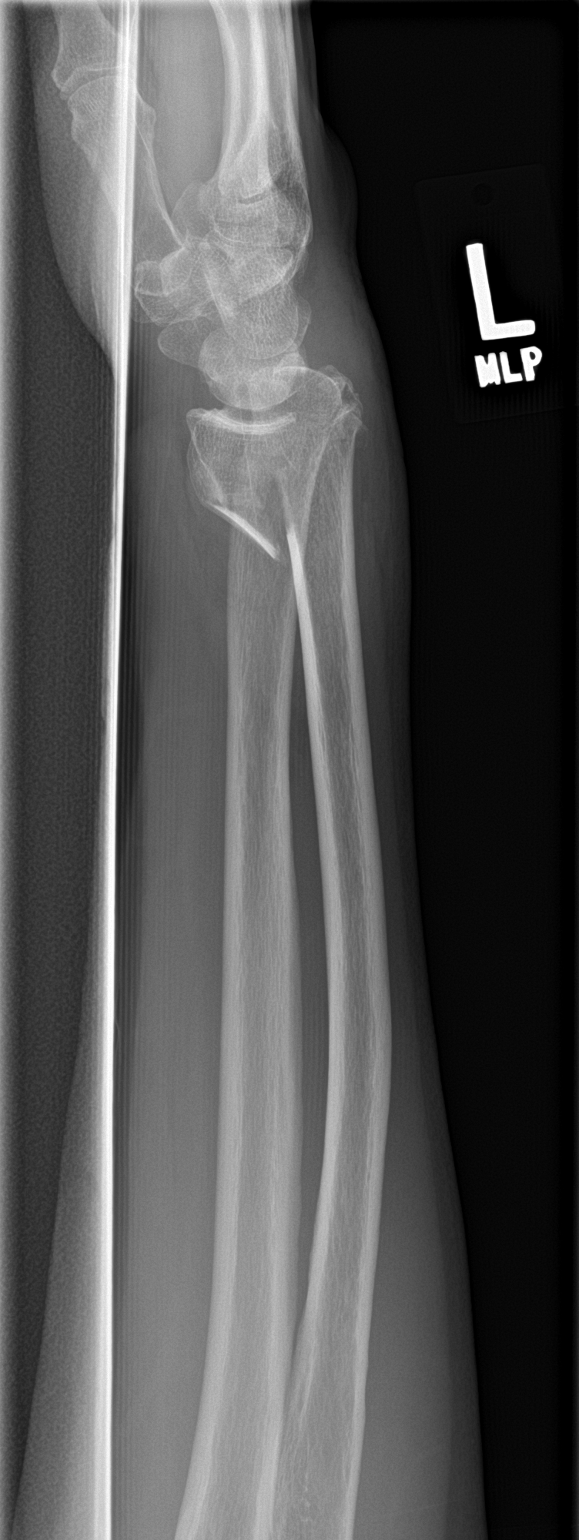

[forearm lat]
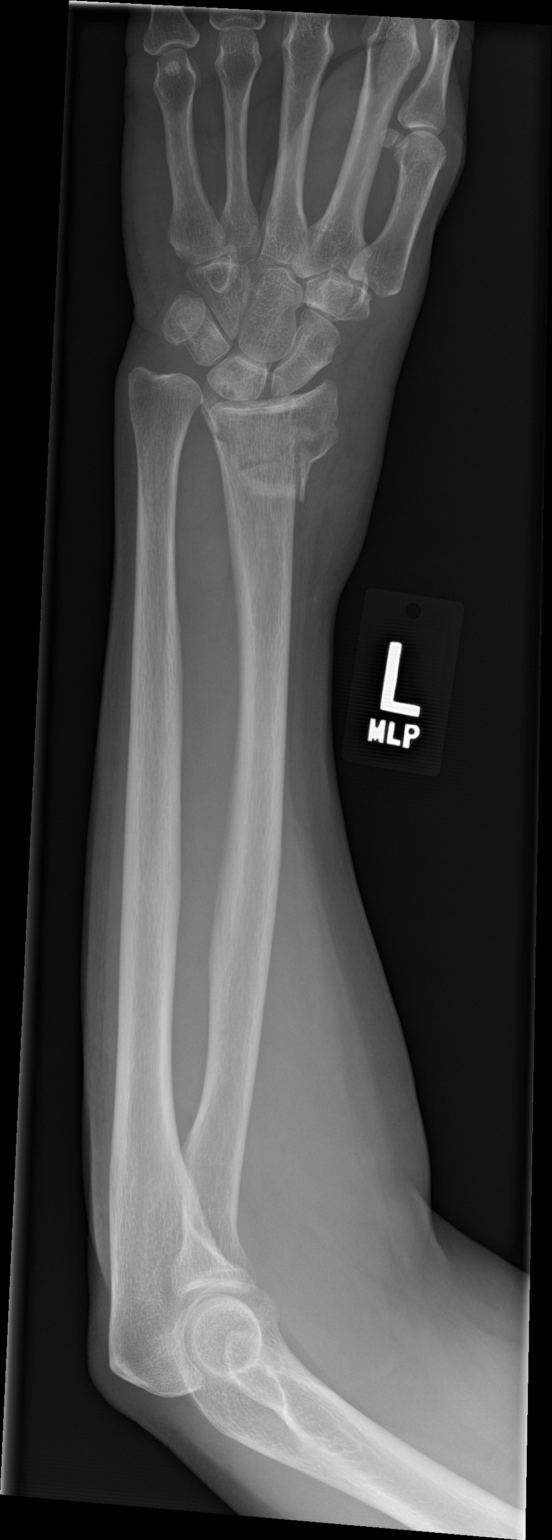

[elbow lat]
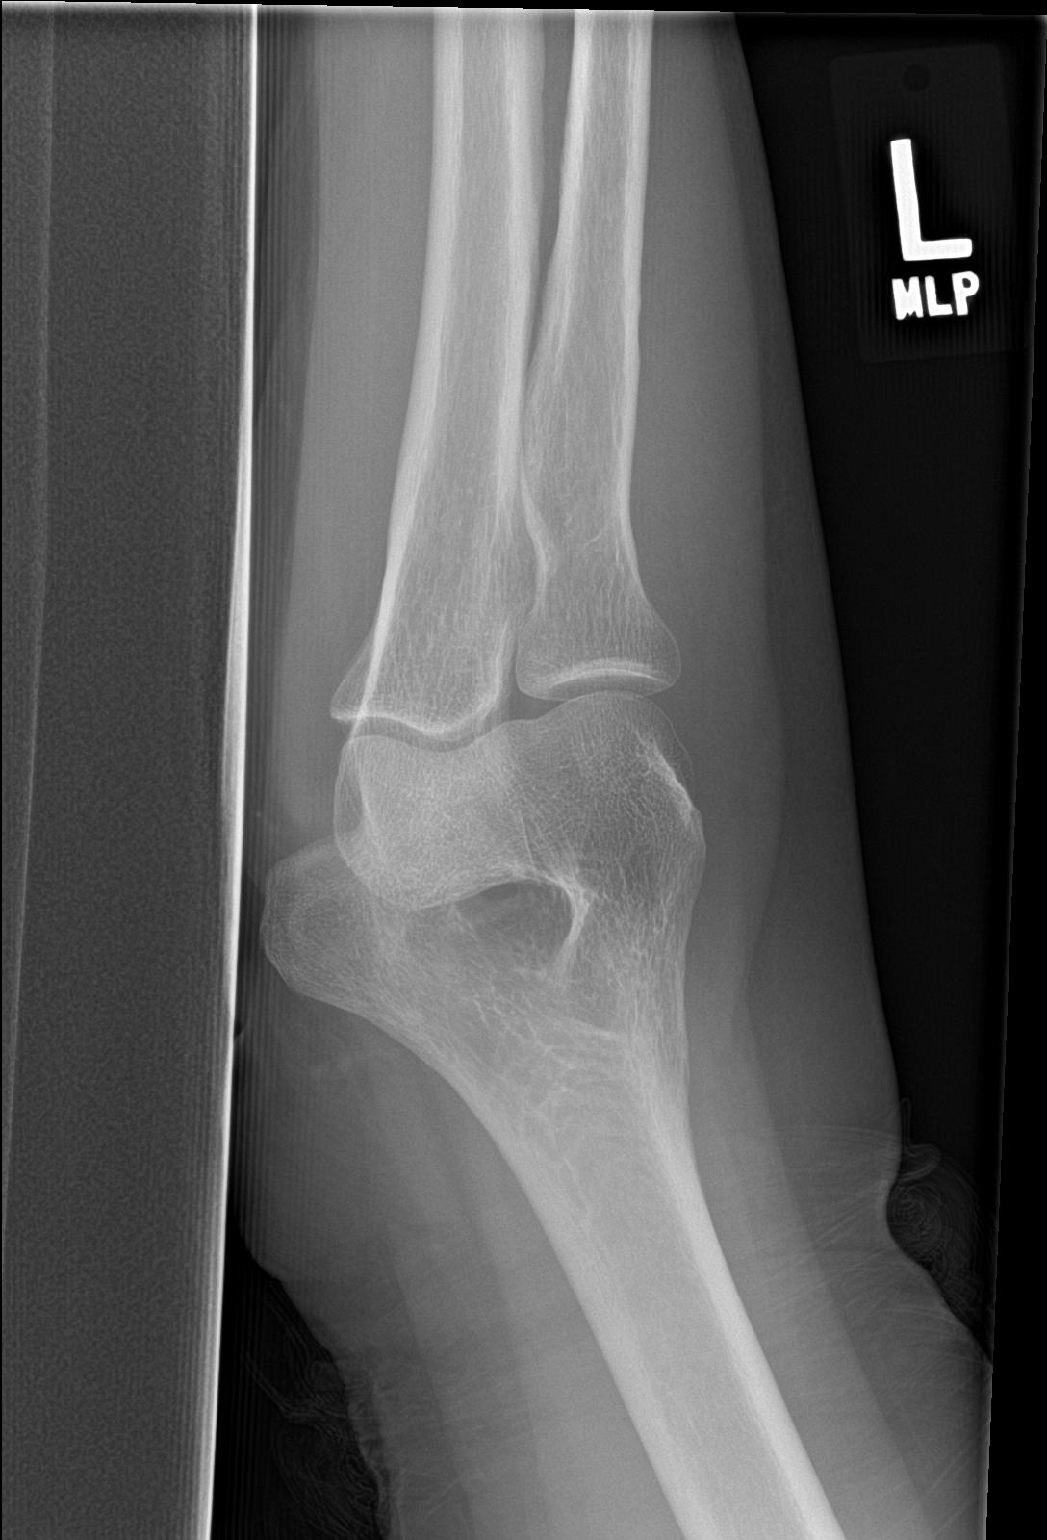

[3 of 3 positions shown; findings below may reference images not displayed]

FINDINGS: Severely displaced and comminuted fracture is seen involving the
distal left radius with intra-articular extension. The ulna is
unremarkable. No soft tissue abnormality is noted.
IMPRESSION: Severely displaced and comminuted distal left radial fracture is
noted with intra-articular extension.

## 2020-08-27 IMAGING — MG DIGITAL SCREENING BILATERAL MAMMOGRAM WITH TOMO AND CAD
8 series · 8 of 24 positions shown · non-contrast
Comparison: Previous exam(s).

CLINICAL DATA: Screening.

EXAM:
DIGITAL SCREENING BILATERAL MAMMOGRAM WITH TOMO AND CAD

[R CC synth-2D]
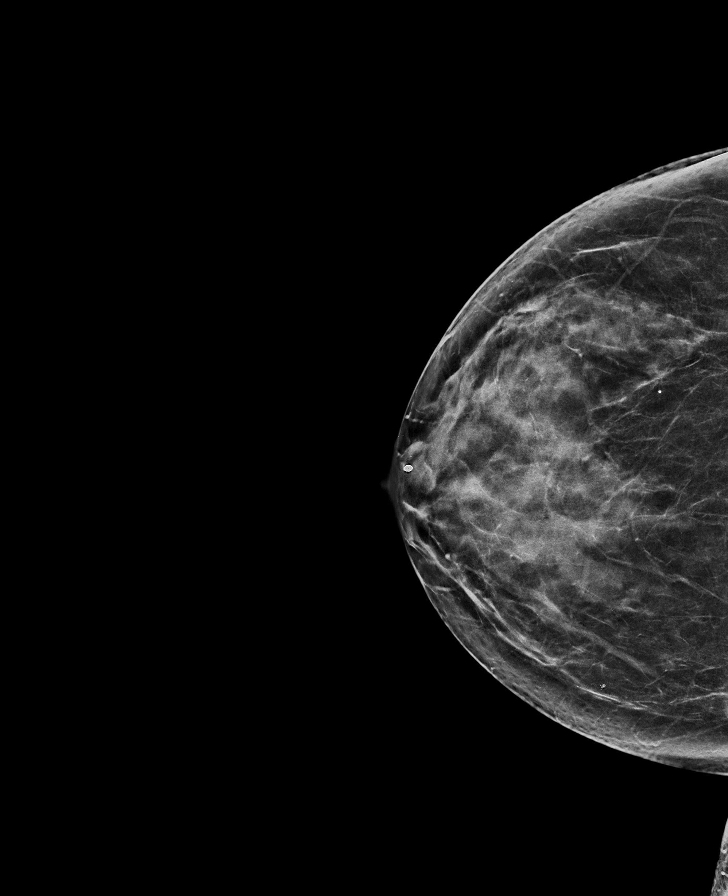

[L CC synth-2D]
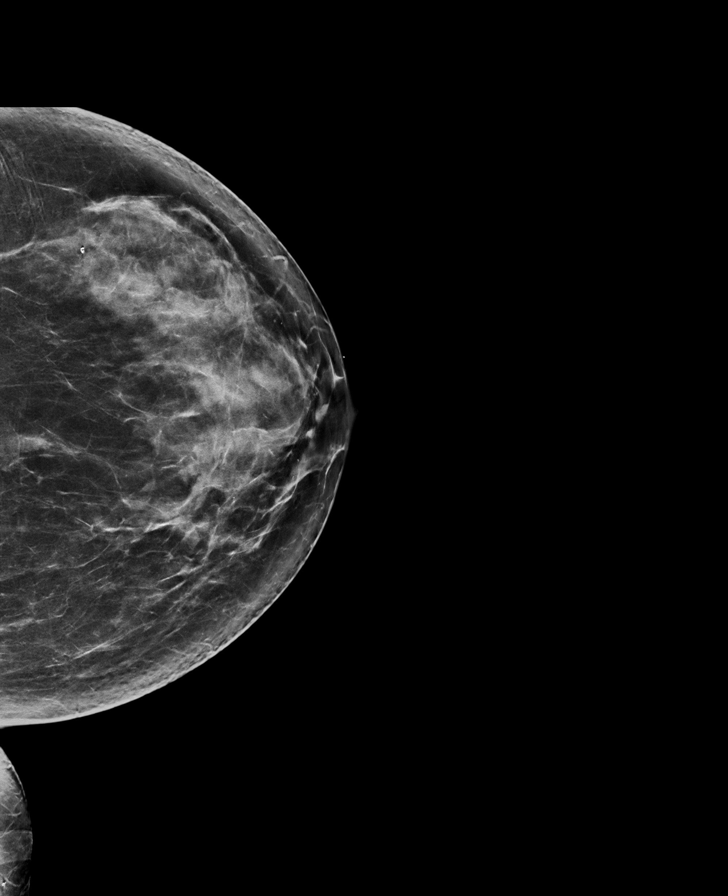

[L MLO synth-2D]
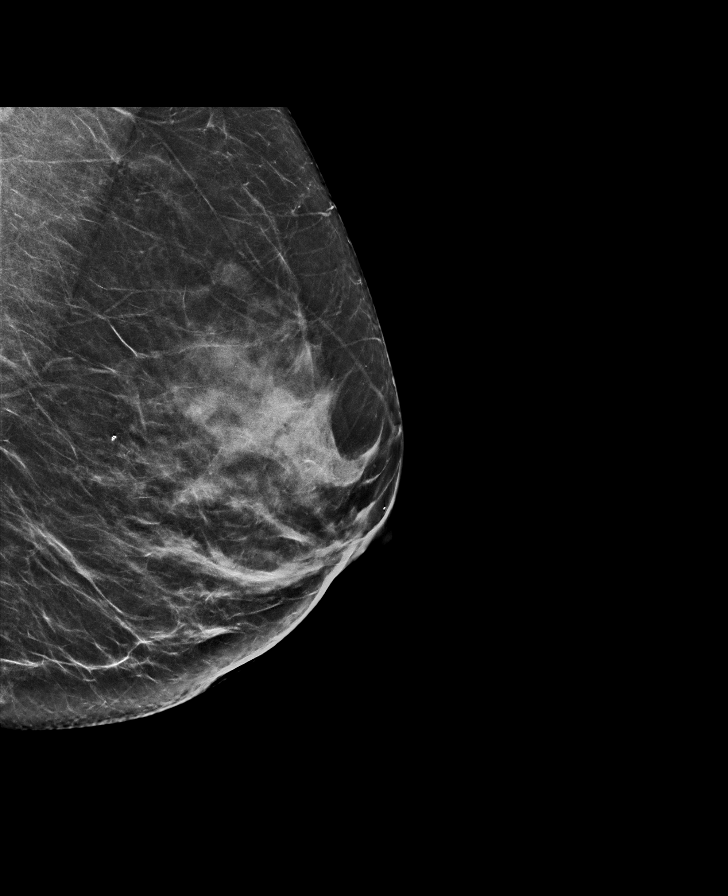

[R MLO synth-2D]
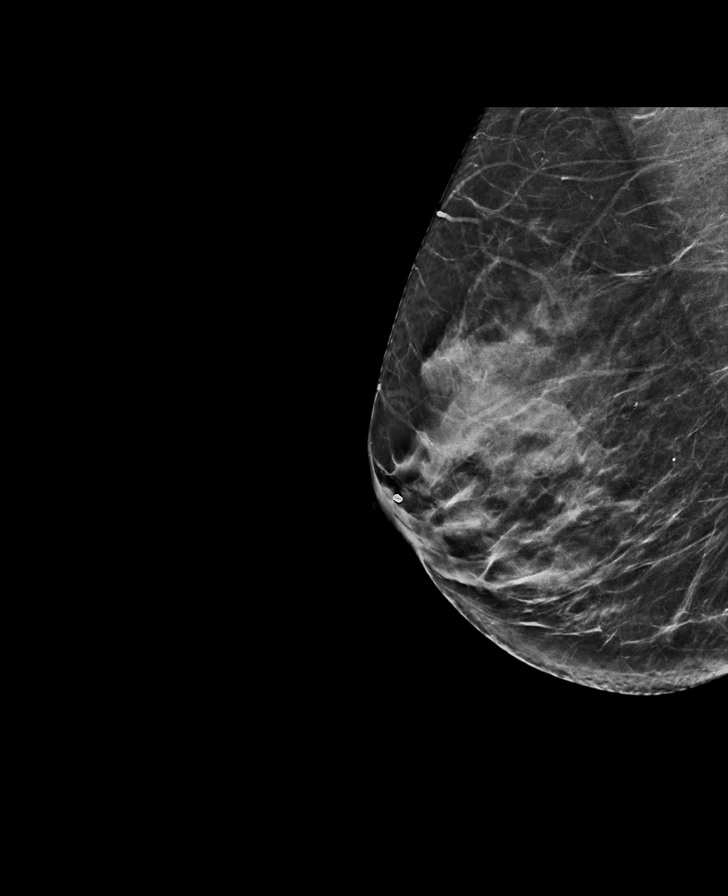

[L MLO tomo · tomo slice 35/68.0]
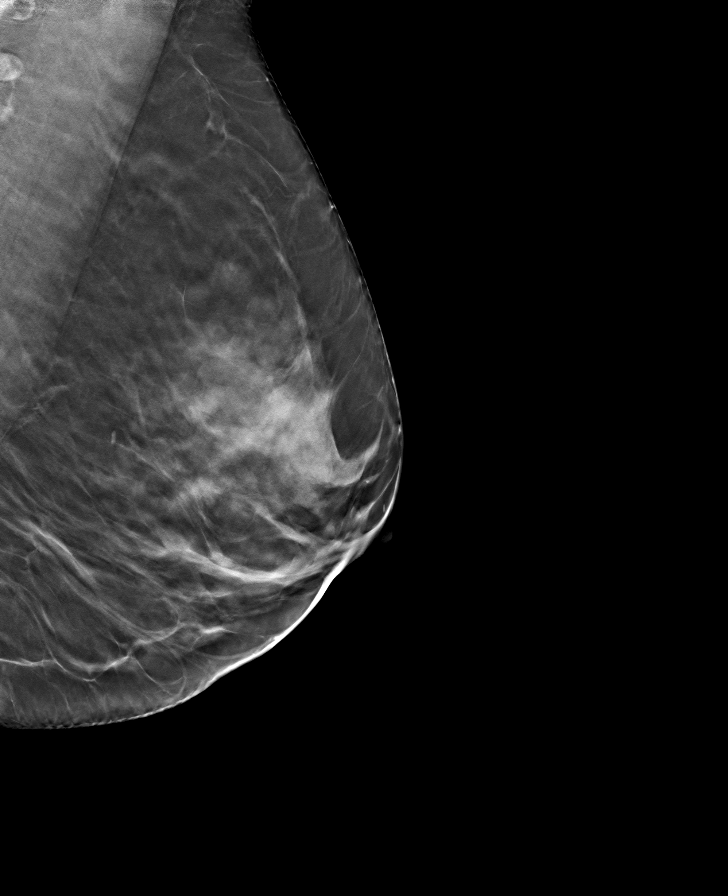

[R MLO tomo · tomo slice 31/62.0]
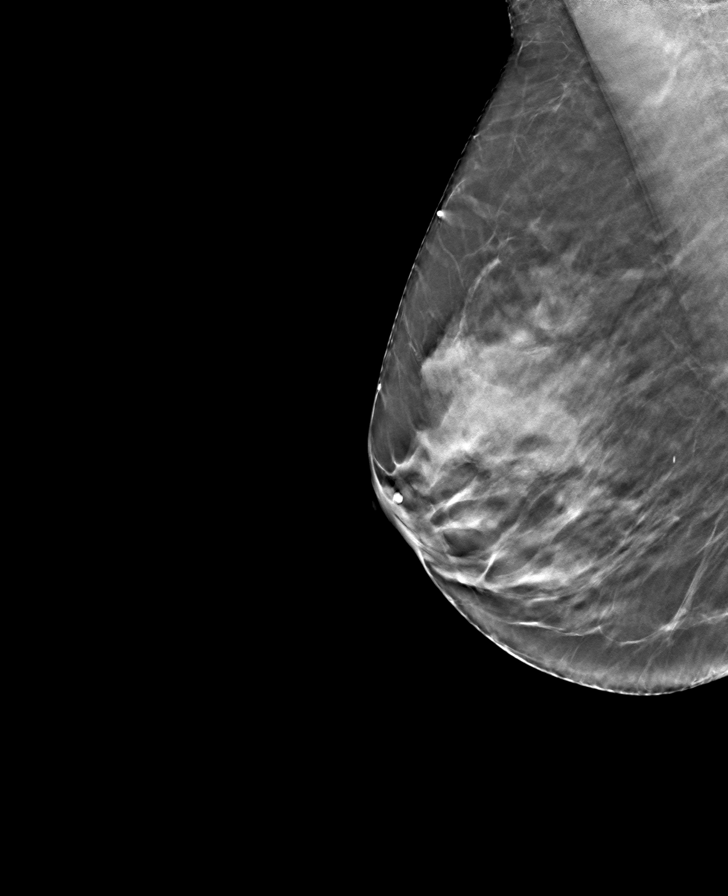

[L CC tomo · tomo slice 41/80.0]
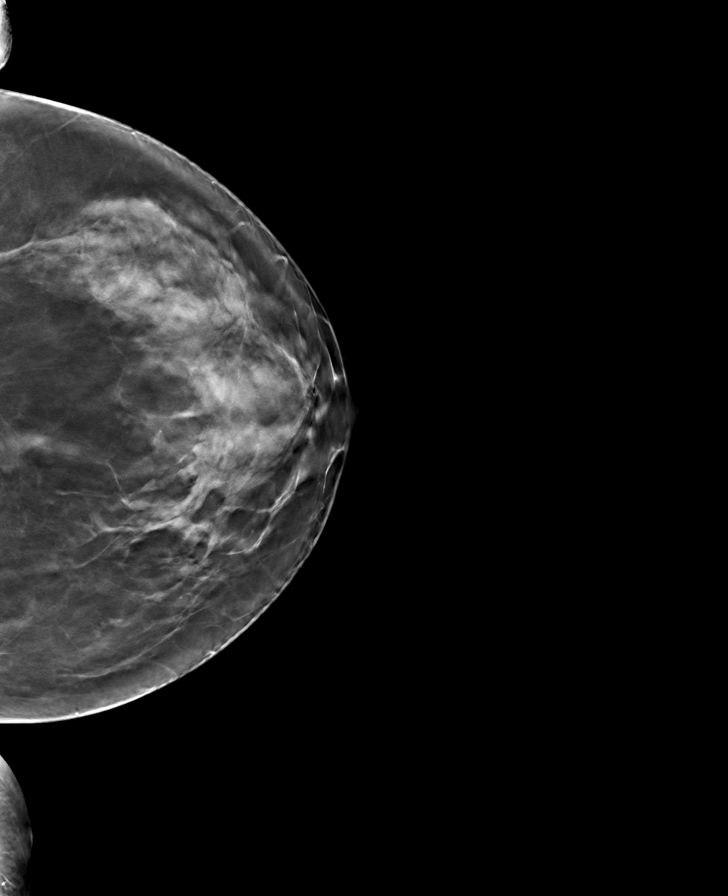

[R CC tomo · tomo slice 33/66.0]
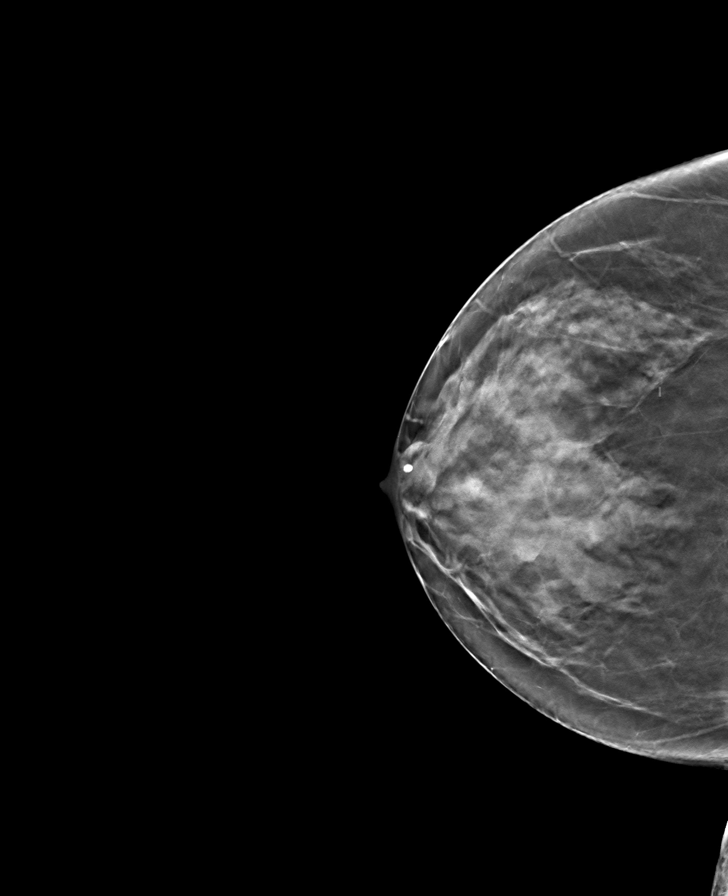

[8 of 24 positions shown; findings below may reference images not displayed]

ACR Breast Density Category c: The breast tissue is heterogeneously
dense, which may obscure small masses.
FINDINGS: There are no findings suspicious for malignancy. Images were
processed with CAD.
IMPRESSION: No mammographic evidence of malignancy. A result letter of this
screening mammogram will be mailed directly to the patient.

RECOMMENDATION:
Screening mammogram in one year. (Code:[id number])

BI-RADS CATEGORY  1: Negative.

## 2020-11-03 ENCOUNTER — Telehealth: Payer: 59 | Admitting: Physician Assistant

## 2020-11-03 DIAGNOSIS — R3989 Other symptoms and signs involving the genitourinary system: Secondary | ICD-10-CM

## 2020-11-03 MED ORDER — CEPHALEXIN 500 MG PO CAPS: 500.0000 mg | ORAL_CAPSULE | Freq: Two times a day (BID) | ORAL | 0 refills | Status: DC

## 2020-11-03 NOTE — Progress Notes (Signed)
We are sorry that you are not feeling well.  Here is how we plan to help!  Based on what you shared with me it looks like you most likely have a simple urinary tract infection.  A UTI (Urinary Tract Infection) is a bacterial infection of the bladder.  Most cases of urinary tract infections are simple to treat but a key part of your care is to encourage you to drink plenty of fluids and watch your symptoms carefully.  I have prescribed Keflex 500 mg twice a day for 7 days.  Your symptoms should gradually improve. Call us if the burning in your urine worsens, you develop worsening fever, back pain or pelvic pain or if your symptoms do not resolve after completing the antibiotic.  Urinary tract infections can be prevented by drinking plenty of water to keep your body hydrated.  Also be sure when you wipe, wipe from front to back and don't hold it in!  If possible, empty your bladder every 4 hours.  Your e-visit answers were reviewed by a board certified advanced clinical practitioner to complete your personal care plan.  Depending on the condition, your plan could have included both over the counter or prescription medications.  If there is a problem please reply  once you have received a response from your provider.  Your safety is important to [place].  If you have drug allergies check your prescription carefully.    You can use MyChart to ask questions about today's visit, request a non-urgent call back, or ask for a work or school excuse for 24 hours related to this e-Visit. If it has been greater than 24 hours you will need to follow up with your provider, or enter a new e-Visit to address those concerns.   You will get an e-mail in the next two days asking about your experience.  I hope that your e-visit has been valuable and will speed your recovery. Thank you for using e-visits.  I provided 5 minutes of non face-to-face time during this encounter for chart review and documentation.

## 2020-11-11 ENCOUNTER — Telehealth: Payer: 59 | Admitting: Nurse Practitioner

## 2020-11-11 DIAGNOSIS — R399 Unspecified symptoms and signs involving the genitourinary system: Secondary | ICD-10-CM

## 2020-11-11 NOTE — Progress Notes (Signed)
Based on what you shared with me, I feel your condition warrants further evaluation and I recommend that you be seen for a face to face visit.  Because you have been treated with an antibiotic previously, you most likely will need a urine culture to ensure you are receiving appropriate antibiotic coverage.  You may contact your primary care physician practice to be seen. Many offices offer virtual options to be seen via video if you are not comfortable going in person to a medical facility at this time. You can also visit one of the [facility] urgent care facilities.  If you do not have a PCP, [facility] offers a free physician referral service available at [phone]. Our trained staff has the experience, knowledge and resources to put you in touch with a physician who is right for you.   If you are having a true medical emergency please call 911.  NOTE: If you entered your credit card information for this eVisit, you will not be charged. You may see a "hold" on your card for the $35 but that hold will drop off and you will not have a charge processed.  Your e-visit answers were reviewed by a board certified advanced clinical practitioner to complete your personal care plan.  Thank you for using e-Visits.  I have spent at least 5 minutes reviewing and documenting in the patient's chart.

## 2021-01-11 ENCOUNTER — Telehealth: Payer: 59 | Admitting: Physician Assistant

## 2021-01-11 DIAGNOSIS — Z87440 Personal history of urinary (tract) infections: Secondary | ICD-10-CM | POA: Insufficient documentation

## 2021-01-11 DIAGNOSIS — N3000 Acute cystitis without hematuria: Secondary | ICD-10-CM | POA: Insufficient documentation

## 2021-01-11 NOTE — Progress Notes (Signed)
[place] 

## 2021-03-13 ENCOUNTER — Ambulatory Visit (INDEPENDENT_AMBULATORY_CARE_PROVIDER_SITE_OTHER): Payer: Self-pay | Admitting: *Deleted

## 2021-03-13 ENCOUNTER — Other Ambulatory Visit: Payer: Self-pay

## 2021-03-13 VITALS — Ht 67.0 in | Wt 163.0 lb

## 2021-03-13 DIAGNOSIS — Z1211 Encounter for screening for malignant neoplasm of colon: Secondary | ICD-10-CM

## 2021-03-13 MED ORDER — CLENPIQ 10-3.5-12 MG-GM -GM/160ML PO SOLN: 1.0000 | Freq: Once | ORAL | 0 refills | Status: AC

## 2021-03-13 NOTE — Progress Notes (Addendum)
Gastroenterology Pre-Procedure Review  Request Date: 03/13/2021 Requesting Physician: 5 year recall, Last TCS done 01/29/2015 by Dr. Gala Romney, normal colon, family hx of colon cancer (mother)  PATIENT REVIEW QUESTIONS: The patient responded to the following health history questions as indicated:    1. Diabetes Melitis: no 2. Joint replacements in the past 12 months: no 3. Major health problems in the past 3 months: no 4. Has an artificial valve or MVP: no 5. Has a defibrillator: no 6. Has been advised in past to take antibiotics in advance of a procedure like teeth cleaning: no 7. Family history of colon cancer: yes, mother: age 26  8. Alcohol Use: yes, 1 glass of wine a night 9. Illicit drug Use: no 10. History of sleep apnea: no  11. History of coronary artery or other vascular stents placed within the last 12 months: no 12. History of any prior anesthesia complications: yes, hard to wake 13. Body mass index is 25.53 kg/m.    MEDICATIONS & ALLERGIES:    Patient reports the following regarding taking any blood thinners:   Plavix? no Aspirin? no Coumadin? no Brilinta? no Xarelto? no Eliquis? no Pradaxa? no Savaysa? no Effient? no  Patient confirms/reports the following medications:  No current outpatient medications on file.   No current facility-administered medications for this visit.    Patient confirms/reports the following allergies:  No Known Allergies  No orders of the defined types were placed in this encounter.   [facility] INFORMATION Primary Insurance: [facility],  [place] #: [id number],  Group #: [id number] Pre-Cert / Auth required: Yes, approved online [id number] Pre-Cert / Josem Kaufmann #: [id number]  SCHEDULE INFORMATION: Procedure has been scheduled as follows:  Date: 05/22/2021, Time: 7:30 Location: [facility] with Dr. Gala Romney  This Gastroenterology Pre-Precedure Review Form is being routed to the following provider(s): Neil Crouch, PA-C

## 2021-03-13 NOTE — Progress Notes (Signed)
Pt wants procedure done in Oct/Nov.  She said that her schedule is really busy in Sept.

## 2021-03-13 NOTE — Patient Instructions (Addendum)
[place]   Patient Name:  Sharon Hayes Date of procedure: 05/22/2021 Time to register at [facility] Stay: 6:30 am Provider:  Dr. Gala Romney  Please notify us immediately if you are diabetic, take iron supplements, or if you are on coumadin or any blood thinners.  Please hold the following medications: n/a  Note: Do NOT refrigerate or freeze CLENPIQ. CLENPIQ is ready to drink. There is no need to add any other liquid or mix the medicine in the bottle before you start dosing.    05/21/2021- 1 Day prior to procedure:  Tuesday  CLEAR LIQUIDS ALL DAY--NO SOLID FOODS OR DAIRY PRODUCTS! See list of liquids that are allowed and items that are NOT allowed below.  Diabetic Medication Instructions: n/a  You must drink plenty of CLEAR LIQUIDS starting before your bowel prep. It is important to stay adequately hydrated before, during, and after your bowel prep for the prep to work effectively!  At 4:00 PM Begin the prep as follows:    Drink one bottle of pre-mixed CLENPIQ right from the bottle.  Drink at least five (5) 8-ounce drinks of clear liquids of your choice within the next 5 hours  At 10:00 PM: Drink the second bottle of pre-mixed CLENPIQ right from the bottle.   Drink at least three (3) 8-ounce drinks of clear liquids of your choice within the next 3 hours before going to bed.  Continue clear liquids.     05/22/2021- Day of Procedure: Wednesday  Diabetic medications adjustments: n/a  You may take TYLENOL products.  Please continue your regular medications unless we have instructed you otherwise.    At 3 hours before procedure @ 4:30 am: Stop drinking all liquids, nothing by mouth at this point.   Please note, on the day of your procedure you MUST be accompanied by an adult who is willing to assume responsibility for you at time of discharge. If you do not have such person with you, your procedure will have to be rescheduled.                                                                                                                      Please leave ALL jewelry at home prior to coming to the hospital for your procedure.   *It is your responsibility to check with your insurance company for the benefits of coverage you have for this procedure. Unfortunately, not all insurance companies have benefits to cover all or part of these types of procedures. It is your responsibility to check your benefits, however we will be glad to assist you with any codes your insurance company may need.   Please note that most insurance companies will not cover a screening colonoscopy for people under the age of 52  For example, with some insurance companies you may have benefits for a screening colonoscopy, but if polyps are found the diagnosis will change and then you may have a deductible that will need to be met. Please make  sure you check your benefits for screening colonoscopy as well as a diagnostic colonoscopy.    CLEAR LIQUIDS: (NO RED or PURPLE) Water  Jello   Apple Juice  White Grape Juice   Kool-Aid Soft drinks  Banana popsicles Sports Drink  Black coffee (No cream or milk) Tea (No cream or milk)  Broth (fat free beef/chicken/vegetable)  Clear liquids allow you to see your fingers on the other side of the glass.  Be sure they are NOT RED or PURPLE in color, cloudy, but CLEAR.  Do Not Eat: Dairy products of any kind Cranberry juice Tomato or V8 Juice  Orange Juice   Grapefruit Juice Red Grape Juice Alcohol   Non-dairy creamer Solid foods like cereal, oatmeal, yogurt, fruits, vegetables, creamed soups, eggs, bread, etc   HELPFUL HINTS TO MAKE DRINKING EASIER: -Trying drinking through a straw. -If you become nauseated, try consuming smaller amounts or stretch out the time between glasses.  Stop for 30 minutes & slowly start back drinking.  Call our office with any questions or concerns at [phone].  Thank You,  Christ Kick, [place]

## 2021-03-15 NOTE — Progress Notes (Signed)
OK to schedule. Conscious sedation. ASA I.

## 2021-03-20 ENCOUNTER — Telehealth: Payer: Self-pay | Admitting: *Deleted

## 2021-03-20 NOTE — Telephone Encounter (Signed)
Lmom for pt to call me back.  Need to schedule procedure.  Oct procedure schedules now available.

## 2021-03-22 NOTE — Telephone Encounter (Addendum)
Spoke to pt.  She requested Nov procedure.  She would like to have the earliest procedure of the day.  Pt made aware that I will call her once Nov procedure schedules are available.

## 2021-04-17 ENCOUNTER — Telehealth: Payer: Self-pay | Admitting: *Deleted

## 2021-04-17 NOTE — Telephone Encounter (Signed)
Called pt back.  She informed me that Nov. 9 would be a good day for her to have procedure if available.  She is aware that I made a note of it and will call her back once Nov procedures has been released.

## 2021-04-17 NOTE — Telephone Encounter (Signed)
Pt was triaged in August and called today asking to speak to the one that schedules colonoscopies. Please call [phone]

## 2021-04-19 ENCOUNTER — Other Ambulatory Visit: Payer: Self-pay | Admitting: *Deleted

## 2021-04-19 NOTE — Progress Notes (Signed)
Spoke to pt and scheduled procedure for 05/22/2021 at 7:30 with arrival at 6:30.  Pt aware that I am mailing out prep instructions.

## 2021-05-02 ENCOUNTER — Other Ambulatory Visit (HOSPITAL_COMMUNITY): Payer: Self-pay | Admitting: Family Medicine

## 2021-05-02 DIAGNOSIS — Z1231 Encounter for screening mammogram for malignant neoplasm of breast: Secondary | ICD-10-CM

## 2021-05-20 ENCOUNTER — Ambulatory Visit (HOSPITAL_COMMUNITY)
Admission: RE | Admit: 2021-05-20 | Discharge: 2021-05-20 | Disposition: A | Discharge status: Home / Self Care | Payer: 59 | Source: Ambulatory Visit | Attending: Family Medicine | Admitting: Family Medicine

## 2021-05-20 ENCOUNTER — Other Ambulatory Visit: Payer: Self-pay

## 2021-05-20 DIAGNOSIS — Z1231 Encounter for screening mammogram for malignant neoplasm of breast: Secondary | ICD-10-CM | POA: Diagnosis present

## 2021-05-22 ENCOUNTER — Other Ambulatory Visit: Payer: Self-pay

## 2021-05-22 ENCOUNTER — Encounter (HOSPITAL_COMMUNITY): Payer: Self-pay | Admitting: Internal Medicine

## 2021-05-22 ENCOUNTER — Ambulatory Visit (HOSPITAL_COMMUNITY)
Admission: RE | Admit: 2021-05-22 | Discharge: 2021-05-22 | Disposition: A | Discharge status: Home / Self Care | Payer: 59 | Attending: Internal Medicine | Admitting: Internal Medicine

## 2021-05-22 ENCOUNTER — Encounter (HOSPITAL_COMMUNITY)
Admission: RE | Disposition: A | Discharge status: Home / Self Care | Payer: Self-pay | Source: Home / Self Care | Attending: Internal Medicine

## 2021-05-22 DIAGNOSIS — Z1211 Encounter for screening for malignant neoplasm of colon: Secondary | ICD-10-CM | POA: Diagnosis present

## 2021-05-22 DIAGNOSIS — D123 Benign neoplasm of transverse colon: Secondary | ICD-10-CM | POA: Insufficient documentation

## 2021-05-22 DIAGNOSIS — K635 Polyp of colon: Secondary | ICD-10-CM

## 2021-05-22 DIAGNOSIS — Z86010 Personal history of colonic polyps: Secondary | ICD-10-CM

## 2021-05-22 DIAGNOSIS — Z800 Family history of malignant neoplasm of digestive organs: Secondary | ICD-10-CM | POA: Diagnosis not present

## 2021-05-22 HISTORY — PX: COLONOSCOPY: SHX5424

## 2021-05-22 HISTORY — PX: POLYPECTOMY: SHX5525

## 2021-05-22 SURGERY — COLONOSCOPY
Anesthesia: Moderate Sedation

## 2021-05-22 MED ORDER — MIDAZOLAM HCL 5 MG/5ML IJ SOLN
INTRAMUSCULAR | Status: DC | PRN
  Administered 2021-05-22 (×2): 1 mg via INTRAVENOUS
  Administered 2021-05-22: 2 mg via INTRAVENOUS

## 2021-05-22 MED ORDER — MEPERIDINE HCL 100 MG/ML IJ SOLN
INTRAMUSCULAR | Status: DC | PRN
  Administered 2021-05-22: 10 mg via INTRAVENOUS
  Administered 2021-05-22: 40 mg via INTRAVENOUS

## 2021-05-22 MED ORDER — ONDANSETRON HCL 4 MG/2ML IJ SOLN
INTRAMUSCULAR | Status: AC
  Filled 2021-05-22: qty 2

## 2021-05-22 MED ORDER — ONDANSETRON HCL 4 MG/2ML IJ SOLN
INTRAMUSCULAR | Status: DC | PRN
  Administered 2021-05-22: 4 mg via INTRAVENOUS

## 2021-05-22 MED ORDER — MEPERIDINE HCL 50 MG/ML IJ SOLN
INTRAMUSCULAR | Status: AC
  Filled 2021-05-22: qty 1

## 2021-05-22 MED ORDER — SODIUM CHLORIDE 0.9 % IV SOLN: INTRAVENOUS | Status: DC

## 2021-05-22 MED ORDER — MIDAZOLAM HCL 5 MG/5ML IJ SOLN
INTRAMUSCULAR | Status: AC
  Filled 2021-05-22: qty 10

## 2021-05-22 NOTE — H&P (Signed)
@LOGO @   Primary Care Physician:  Christain Sacramento, MD Primary Gastroenterologist:  Dr. Gala Romney  Pre-Procedure History & Physical: HPI:  Sharon Hayes is a 60 y.o. female here for cervical venous colonoscopy.  Personal history colonic polyps positive family history colon cancer in first-degree relative at a young age.  Past Medical History:  Diagnosis Date   Colon polyps    Complication of anesthesia    Hard to wake up.   Varicose veins     Past Surgical History:  Procedure Laterality Date   COLONOSCOPY  11/24/07   YYQ:MGNOIB polyp at 15 cm anal verge otherwise normal   COLONOSCOPY N/A 01/29/2015   Procedure: COLONOSCOPY;  Surgeon: Daneil Dolin, MD;  [facility];  Service: Endoscopy;  Laterality: N/A;  0730   ENDOVENOUS ABLATION SAPHENOUS VEIN W/ LASER Left 05-17-2015   endovenous laser ablation left greater saphenous vein and stab phlebectomy 10-20 incisions left leg by Curt Jews MD    ORIF WRIST FRACTURE Left 11/09/2018   Procedure: OPEN REDUCTION INTERNAL FIXATION (ORIF) WRIST FRACTURE;  Surgeon: Roseanne Kaufman, MD;  Location: [facility];  Service: Orthopedics;  Laterality: Left;   PELVIC LAPAROSCOPY      Prior to Admission medications   Not on File    Allergies as of 04/19/2021   (No Known Allergies)    Family History  Problem Relation Age of Onset   Colon cancer Mother 34       colon   Cancer Mother    Varicose Veins Mother    Heart disease Father    Hyperlipidemia Father    Hypertension Father    Heart attack Father    Cancer Maternal Aunt        uterine   Breast cancer Maternal Aunt    Colon polyps Sister     Social History   Socioeconomic History   Marital status: Married    Spouse name: Not on file   Number of children: Not on file   Years of education: Not on file   Highest education level: Not on file  Occupational History   Not on file  Tobacco Use   Smoking status: Never   Smokeless tobacco: Never  Vaping Use   Vaping Use: Never  used  Substance and Sexual Activity   Alcohol use: Yes    Alcohol/week: 9.0 standard drinks    Types: 7 Glasses of wine, 2 Shots of liquor per week    Comment: 1-2 glasses of wine approx 4 days a week   Drug use: No   Sexual activity: Yes    Birth control/protection: Post-menopausal  Other Topics Concern   Not on file  Social History Narrative   Not on file   Social Determinants of Health   Financial Resource Strain: Not on file  Food Insecurity: Not on file  Transportation Needs: Not on file  Physical Activity: Not on file  Stress: Not on file  Social Connections: Not on file  Intimate Partner Violence: Not on file    Review of Systems: See HPI, otherwise negative ROS  Physical Exam: BP 104/74   Temp 97.9 F (36.6 C) (Oral)   Resp 16   Ht 5\' 7"  (1.702 m)   Wt 72.6 kg   LMP  (LMP Unknown)   SpO2 99%   BMI 25.06 kg/m  General:   Alert,  Well-developed, well-nourished, pleasant and cooperative in NAD Neck:  Supple; no masses or thyromegaly. No significant cervical adenopathy. Lungs:  Clear throughout  to auscultation.   No wheezes, crackles, or rhonchi. No acute distress. Heart:  Regular rate and rhythm; no murmurs, clicks, rubs,  or gallops. Abdomen: Non-distended, normal bowel sounds.  Soft and nontender without appreciable mass or hepatosplenomegaly.  Pulses:  Normal pulses noted. Extremities:  Without clubbing or edema.  Impression/Plan: 60 year old lady with a personal history of colonic polyps and positive family history colon cancer here for surveillance colonoscopy. She is here for surveillance colonoscopy per plan. The risks, benefits, limitations, alternatives and imponderables have been reviewed with the patient. Questions have been answered. All parties are agreeable.      Notice: This dictation was prepared with Dragon dictation along with smaller phrase technology. Any transcriptional errors that result from this process are unintentional and may not be  corrected upon review.

## 2021-05-22 NOTE — Op Note (Signed)
[facility] Patient Name: Sharon Hayes Procedure Date: 05/22/2021 7:17 AM MRN: [id number] Date of Birth: [birth year] Attending MD: Norvel Richards , MD CSN: [id number] Age: 60 Admit Type: Outpatient Procedure:                Colonoscopy Indications:              High risk colon cancer surveillance: Personal                            history of colonic polyps Providers:                Norvel Richards, MD, Charlsie Quest. Theda Sers RN, RN,                            Wynonia Musty Tech, Technician Referring MD:              Medicines:                Midazolam 4 mg IV, Meperidine 50 mg IV Complications:            No immediate complications. Estimated Blood Loss:     Estimated blood loss: none. Procedure:                Pre-Anesthesia Assessment:                           - Prior to the procedure, a History and Physical                            was performed, and patient medications and                            allergies were reviewed. The patient's tolerance of                            previous anesthesia was also reviewed. The risks                            and benefits of the procedure and the sedation                            options and risks were discussed with the patient.                            All questions were answered, and informed consent                            was obtained. Prior Anticoagulants: The patient has                            taken no previous anticoagulant or antiplatelet                            agents. ASA Grade Assessment: II - A patient with  mild systemic disease. After reviewing the risks                            and benefits, the patient was deemed in                            satisfactory condition to undergo the procedure.                           After obtaining informed consent, the colonoscope                            was passed under direct vision. Throughout the                             procedure, the patient's blood pressure, pulse, and                            oxygen saturations were monitored continuously. The                            [id number]) scope was introduced through the                            anus and advanced to the the cecum, identified by                            appendiceal orifice and ileocecal valve. The                            colonoscopy was performed without difficulty. The                            patient tolerated the procedure well. The quality                            of the bowel preparation was adequate. The                            ileocecal valve, appendiceal orifice, and rectum                            were photographed. The entire colon was well                            visualized. Scope In: 7:38:47 AM Scope Out: 7:53:38 AM Scope Withdrawal Time: 0 hours 8 minutes 40 seconds  Total Procedure Duration: 0 hours 14 minutes 51 seconds  Findings:      The perianal and digital rectal examinations were normal.      A 8 mm polyp was found in the distal transverse colon. The polyp was       semi-pedunculated. The polyp was removed with a hot snare. Resection and       retrieval were complete. Estimated blood loss: none.  The exam was otherwise without abnormality on direct and retroflexion       views. Impression:               - One 8 mm polyp in the distal transverse colon,                            removed with a hot snare. Resected and retrieved.                           - The examination was otherwise normal on direct                            and retroflexion views. Moderate Sedation:      Moderate (conscious) sedation was administered by the endoscopy nurse       and supervised by the endoscopist. The following parameters were       monitored: oxygen saturation, heart rate, blood pressure, respiratory       rate, EKG, adequacy of pulmonary ventilation, and response to care.       Total physician  intraservice time was 20 minutes. Recommendation:           - Patient has a contact number available for                            emergencies. The signs and symptoms of potential                            delayed complications were discussed with the                            patient. Return to normal activities tomorrow.                            Written discharge instructions were provided to the                            patient.                           - Resume previous diet.                           - Continue present medications.                           - Repeat colonoscopy date to be determined after                            pending pathology results are reviewed for                            surveillance based on pathology results.                           - Return to GI office (date not yet determined). Procedure Code(s):        ---  Professional ---                           [phone], Colonoscopy, flexible; with removal of                            tumor(s), polyp(s), or other lesion(s) by snare                            technique                           G0500, Moderate sedation services provided by the                            same physician or other qualified health care                            professional performing a gastrointestinal                            endoscopic service that sedation supports,                            requiring the presence of an independent trained                            observer to assist in the monitoring of the                            patient's level of consciousness and physiological                            status; initial 15 minutes of intra-service time;                            patient age 32 years or older (additional time may                            be reported with [phone], as appropriate) Diagnosis Code(s):        --- Professional ---                           Z86.010, Personal history of colonic  polyps                           K63.5, Polyp of colon CPT copyright 2019 American Medical Association. All rights reserved. The codes documented in this report are preliminary and upon coder review may  be revised to meet current compliance requirements. Cristopher Estimable. Emiliya Chretien, MD Norvel Richards, MD 05/22/2021 8:00:39 AM This report has been signed electronically. Number of Addenda: 0

## 2021-05-22 NOTE — Discharge Instructions (Signed)
  Colonoscopy Discharge Instructions  Read the instructions outlined below and refer to this sheet in the next few weeks. These discharge instructions provide you with general information on caring for yourself after you leave the hospital. Your doctor may also give you specific instructions. While your treatment has been planned according to the most current medical practices available, unavoidable complications occasionally occur. If you have any problems or questions after discharge, call Dr. Gala Romney at [phone]. ACTIVITY You may resume your regular activity, but move at a slower pace for the next 24 hours.  Take frequent rest periods for the next 24 hours.  Walking will help get rid of the air and reduce the bloated feeling in your belly (abdomen).  No driving for 24 hours (because of the medicine (anesthesia) used during the test).   Do not sign any important legal documents or operate any machinery for 24 hours (because of the anesthesia used during the test).  NUTRITION Drink plenty of fluids.  You may resume your normal diet as instructed by your doctor.  Begin with a light meal and progress to your normal diet. Heavy or fried foods are harder to digest and may make you feel sick to your stomach (nauseated).  Avoid alcoholic beverages for 24 hours or as instructed.  MEDICATIONS You may resume your normal medications unless your doctor tells you otherwise.  WHAT YOU CAN EXPECT TODAY Some feelings of bloating in the abdomen.  Passage of more gas than usual.  Spotting of blood in your stool or on the toilet paper.  IF YOU HAD POLYPS REMOVED DURING THE COLONOSCOPY: No aspirin products for 7 days or as instructed.  No alcohol for 7 days or as instructed.  Eat a soft diet for the next 24 hours.  FINDING OUT THE RESULTS OF YOUR TEST Not all test results are available during your visit. If your test results are not back during the visit, make an appointment with your caregiver to find out the  results. Do not assume everything is normal if you have not heard from your caregiver or the medical facility. It is important for you to follow up on all of your test results.  SEEK IMMEDIATE MEDICAL ATTENTION IF: You have more than a spotting of blood in your stool.  Your belly is swollen (abdominal distention).  You are nauseated or vomiting.  You have a temperature over 101.  You have abdominal pain or discomfort that is severe or gets worse throughout the day.    1 polyp removed from your colon today  Further recommendations to follow pending review of pathology report  At patient request, I called Hassell Done at [phone] -call rolled to voicemail.  Left a message.

## 2021-05-23 LAB — SURGICAL PATHOLOGY

## 2021-05-24 ENCOUNTER — Encounter: Payer: Self-pay | Admitting: Internal Medicine

## 2021-05-27 ENCOUNTER — Encounter (HOSPITAL_COMMUNITY): Payer: Self-pay | Admitting: Internal Medicine

## 2021-07-30 ENCOUNTER — Ambulatory Visit: Payer: 59 | Admitting: Registered Nurse

## 2021-09-02 ENCOUNTER — Ambulatory Visit (INDEPENDENT_AMBULATORY_CARE_PROVIDER_SITE_OTHER): Payer: No Typology Code available for payment source | Admitting: Registered Nurse

## 2021-09-02 ENCOUNTER — Encounter: Payer: Self-pay | Admitting: Registered Nurse

## 2021-09-02 ENCOUNTER — Other Ambulatory Visit: Payer: Self-pay

## 2021-09-02 VITALS — BP 118/72 | HR 66 | Temp 98.2°F | Resp 16 | Ht 67.0 in

## 2021-09-02 DIAGNOSIS — L84 Corns and callosities: Secondary | ICD-10-CM

## 2021-09-02 NOTE — Progress Notes (Signed)
New Patient Office Visit  Subjective:  Patient ID: Sharon Hayes, female    DOB: [birth year]  Age: 61 y.o. MRN: [id number]  CC:  Chief Complaint  Patient presents with   New Patient (Initial Visit)    Patient states she is here to establish care . Patient states she has a spot on her left foot    HPI CANDIES PALM presents for visit to est care.   Spot on L foot: painful Present 6-9 mo Has tried different shoes, topicals.  Has stopped running due to this.  Feels shallow   Past Medical History:  Diagnosis Date   Colon polyps    Complication of anesthesia    Hard to wake up.   Varicose veins     Past Surgical History:  Procedure Laterality Date   COLONOSCOPY  11/24/07   XLK:[id number] polyp at 15 cm anal verge otherwise normal   COLONOSCOPY N/A 01/29/2015   Procedure: COLONOSCOPY;  Surgeon: Daneil Dolin, MD;  [facility];  Service: Endoscopy;  Laterality: N/A;  0730   COLONOSCOPY N/A 05/22/2021   Procedure: COLONOSCOPY;  Surgeon: Daneil Dolin, MD;  [facility];  Service: Endoscopy;  Laterality: N/A;  7:30 / ASA I   ENDOVENOUS ABLATION SAPHENOUS VEIN W/ LASER Left 05-17-2015   endovenous laser ablation left greater saphenous vein and stab phlebectomy 10-20 incisions left leg by Curt Jews MD    ORIF WRIST FRACTURE Left 11/09/2018   Procedure: OPEN REDUCTION INTERNAL FIXATION (ORIF) WRIST FRACTURE;  Surgeon: Roseanne Kaufman, MD;  Location: [facility];  Service: Orthopedics;  Laterality: Left;   PELVIC LAPAROSCOPY     POLYPECTOMY  05/22/2021   Procedure: POLYPECTOMY;  Surgeon: Daneil Dolin, MD;  [facility];  Service: Endoscopy;;    Family History  Problem Relation Age of Onset   Colon cancer Mother 30       colon   Cancer Mother    Varicose Veins Mother    Heart disease Father    Hyperlipidemia Father    Hypertension Father    Heart attack Father    CVA Father    Colon polyps Sister    Cancer Maternal Aunt        uterine    Breast cancer Maternal Aunt     Social History   Socioeconomic History   Marital status: Married    Spouse name: Not on file   Number of children: 3   Years of education: Not on file   Highest education level: Not on file  Occupational History   Occupation: PT  Tobacco Use   Smoking status: Never   Smokeless tobacco: Never  Vaping Use   Vaping Use: Never used  Substance and Sexual Activity   Alcohol use: Yes    Alcohol/week: 9.0 standard drinks    Types: 7 Glasses of wine, 2 Shots of liquor per week    Comment: 1-2 glasses of wine approx 4 days a week   Drug use: No   Sexual activity: Yes    Birth control/protection: Post-menopausal  Other Topics Concern   Not on file  Social History Narrative   Not on file   Social Determinants of Health   Financial Resource Strain: Not on file  Food Insecurity: Not on file  Transportation Needs: Not on file  Physical Activity: Not on file  Stress: Not on file  Social Connections: Not on file  Intimate Partner Violence: Not on file  ROS Review of Systems  Constitutional: Negative.   HENT: Negative.    Eyes: Negative.   Respiratory: Negative.    Cardiovascular: Negative.   Gastrointestinal: Negative.   Genitourinary: Negative.   Musculoskeletal: Negative.   Skin: Negative.   Neurological: Negative.   Psychiatric/Behavioral: Negative.    All other systems reviewed and are negative.  Objective:   Today's Vitals: BP 118/72    Pulse 66    Temp 98.2 F (36.8 C) (Temporal)    Resp 16    Ht 5\' 7"  (1.702 m)    LMP  (LMP Unknown)    SpO2 100%    BMI 25.06 kg/m   Physical Exam Vitals and nursing note reviewed.  Constitutional:      General: She is not in acute distress.    Appearance: Normal appearance. She is not ill-appearing, toxic-appearing or diaphoretic.  Cardiovascular:     Rate and Rhythm: Normal rate and regular rhythm.     Pulses: Normal pulses.     Heart sounds: Normal heart sounds. No murmur heard.   No  friction rub. No gallop.  Pulmonary:     Effort: Pulmonary effort is normal. No respiratory distress.     Breath sounds: Normal breath sounds. No stridor. No wheezing, rhonchi or rales.  Chest:     Chest wall: No tenderness.  Skin:    General: Skin is warm and dry.     Capillary Refill: Capillary refill takes less than 2 seconds.  Neurological:     General: No focal deficit present.     Mental Status: She is alert and oriented to person, place, and time. Mental status is at baseline.  Psychiatric:        Mood and Affect: Mood normal.        Behavior: Behavior normal.        Thought Content: Thought content normal.        Judgment: Judgment normal.    Assessment & Plan:   Problem List Items Addressed This Visit   None Visit Diagnoses     Corns and callus    -  Primary       No outpatient encounter medications on file as of 09/02/2021.   No facility-administered encounter medications on file as of 09/02/2021.    Follow-up: Return in about 8 months (around 05/02/2022) for CPE and labs.   PLAN Suspected plantar wart vs corn on outside of feet. Will recommend compound W, soaks. Return if worsening or failing to improve Patient encouraged to call clinic with any questions, comments, or concerns.  Maximiano Coss, NP

## 2021-09-02 NOTE — Patient Instructions (Addendum)
Ms. Cai -   Let's go for a more mild course at first - Compound W and duct tape   If no improvement or if a "seed" seems to appear, can go to cryo therapy  See you in 8 mo or so for CPE and labs  Thank you  Rich     If you have lab work done today you will be contacted with your lab results within the next 2 weeks.  If you have not heard from [place] then please contact us. The fastest way to get your results is to register for My Chart.   IF you received an x-ray today, you will receive an invoice from [facility]. Please contact [facility] at [phone] with questions or concerns regarding your invoice.   IF you received labwork today, you will receive an invoice from [facility]. Please contact [facility] at [phone] with questions or concerns regarding your invoice.   Our billing staff will not be able to assist you with questions regarding bills from these companies.  You will be contacted with the lab results as soon as they are available. The fastest way to get your results is to activate your My Chart account. Instructions are located on the last page of this paperwork. If you have not heard from [place] regarding the results in 2 weeks, please contact this office.

## 2022-02-17 ENCOUNTER — Encounter: Payer: No Typology Code available for payment source | Admitting: Registered Nurse

## 2022-03-03 ENCOUNTER — Ambulatory Visit (INDEPENDENT_AMBULATORY_CARE_PROVIDER_SITE_OTHER): Payer: No Typology Code available for payment source | Admitting: Family Medicine

## 2022-03-03 VITALS — BP 124/66 | HR 58 | Temp 98.0°F | Resp 15 | Ht 67.0 in | Wt 161.6 lb

## 2022-03-03 DIAGNOSIS — Z23 Encounter for immunization: Secondary | ICD-10-CM

## 2022-03-03 DIAGNOSIS — Z13220 Encounter for screening for lipoid disorders: Secondary | ICD-10-CM

## 2022-03-03 DIAGNOSIS — Z0000 Encounter for general adult medical examination without abnormal findings: Secondary | ICD-10-CM

## 2022-03-03 DIAGNOSIS — Z8249 Family history of ischemic heart disease and other diseases of the circulatory system: Secondary | ICD-10-CM

## 2022-03-03 DIAGNOSIS — Z124 Encounter for screening for malignant neoplasm of cervix: Secondary | ICD-10-CM

## 2022-03-03 DIAGNOSIS — Z131 Encounter for screening for diabetes mellitus: Secondary | ICD-10-CM

## 2022-03-03 NOTE — Progress Notes (Signed)
Subjective:  Patient ID: Sharon Hayes, female    DOB: 1960-12-08  Age: 61 y.o. MRN: [id number]  CC:  Chief Complaint  Patient presents with   Annual Exam    Pt here for annual, not fasting     HPI Sharon Hayes presents for Annual Exam  Previous patient of Maximiano Coss, NP History of varicose veins (left leg vein surgery few years ago- Dr. Donnetta Hutching), colon polyps, family history of heart disease (father with MI at age 62) per problem list no prescription meds. Physical therapist with [facility]      03/03/2022    2:08 PM 09/02/2021    3:44 PM  Depression screen PHQ 2/9  Decreased Interest 0 0  Down, Depressed, Hopeless 0 0  PHQ - 2 Score 0 0  Altered sleeping 1 1  Tired, decreased energy 1 0  Change in appetite 0 0  Feeling bad or failure about yourself  0 0  Trouble concentrating 0 0  Moving slowly or fidgety/restless 0 0  Suicidal thoughts 0 0  PHQ-9 Score 2 1  Difficult doing work/chores  Not difficult at all    Health Maintenance  Topic Date Due   PAP SMEAR-Modifier  02/01/2019   Zoster Vaccines- Shingrix (1 of 2) 06/03/2022 (Originally 01/22/2011)   TETANUS/TDAP  09/02/2022 (Originally 07/14/2021)   Hepatitis C Screening  09/02/2022 (Originally 01/22/1979)   INFLUENZA VACCINE  10/13/2022 (Originally 02/11/2022)   HIV Screening  03/04/2023 (Originally 01/22/1976)   MAMMOGRAM  05/21/2023   COLONOSCOPY (Pts 45-50yr Insurance coverage will need to be confirmed)  05/23/2031   HPV VACCINES  Aged Out  Colonoscopy 05/22/2021, Dr. RGala Romney Single 8 mm polyp removed.  Repeat 5 years. Mammogram 05/20/2021 Pap testing, has performed at GYN, prior GYN retired. Referral placed.   There is no immunization history for the selected administration types on file for this patient. Covid vaccine x 3, bivalent booster planned at time of  Flu vaccine - plans at work.  Shingrix vaccine #1 today.   No results found. [facility] - appt 9/19. S/p Lasik.   Dental:  every 6 months, Dr. AJetty Duhamel Alcohol: 5 per week.   Tobacco: none.   Exercise: Walking 20 min daily, 15 min strengthening at lunch.   Not fasting today.  No new heath concerns.  The 10-year ASCVD risk score (Arnett DK, et al., 2019) is: 3.1%   Values used to calculate the score:     Age: [age]years     Sex: Female     Is Non-Hispanic African American: No     Diabetic: No     Tobacco smoker: No     Systolic Blood Pressure: 1846mmHg     Is BP treated: No     HDL Cholesterol: 77 MG/DL     Total Cholesterol: 232 MG/DL   History Patient Active Problem List   Diagnosis Date Noted   Atypical chest pain 07/13/2019   Family history of heart disease 07/13/2019   Varicose veins of both lower extremities with complications 165/99/3570  History of colonic polyps 11/20/2014   Past Medical History:  Diagnosis Date   Colon polyps    Complication of anesthesia    Hard to wake up.   Varicose veins    Past Surgical History:  Procedure Laterality Date   COLONOSCOPY  11/24/07   RVXB:LTJQZEpolyp at 15 cm anal verge otherwise normal   COLONOSCOPY N/A 01/29/2015   Procedure: COLONOSCOPY;  Surgeon: RHerbie Baltimore  Hilton Cork, MD;  [facility];  Service: Endoscopy;  Laterality: N/A;  0730   COLONOSCOPY N/A 05/22/2021   Procedure: COLONOSCOPY;  Surgeon: Daneil Dolin, MD;  [facility];  Service: Endoscopy;  Laterality: N/A;  7:30 / ASA I   ENDOVENOUS ABLATION SAPHENOUS VEIN W/ LASER Left 05-17-2015   endovenous laser ablation left greater saphenous vein and stab phlebectomy 10-20 incisions left leg by Curt Jews MD    ORIF WRIST FRACTURE Left 11/09/2018   Procedure: OPEN REDUCTION INTERNAL FIXATION (ORIF) WRIST FRACTURE;  Surgeon: Roseanne Kaufman, MD;  Location: [facility];  Service: Orthopedics;  Laterality: Left;   PELVIC LAPAROSCOPY     POLYPECTOMY  05/22/2021   Procedure: POLYPECTOMY;  Surgeon: Daneil Dolin, MD;  [facility]: [facility];  Service: Endoscopy;;   No Known  Allergies Prior to Admission medications   Not on File   Social History   Socioeconomic History   Marital status: Married    Spouse name: Not on file   Number of children: 3   Years of education: Not on file   Highest education level: Not on file  Occupational History   Occupation: PT  Tobacco Use   Smoking status: Never   Smokeless tobacco: Never  Vaping Use   Vaping Use: Never used  Substance and Sexual Activity   Alcohol use: Yes    Alcohol/week: 9.0 standard drinks of alcohol    Types: 7 Glasses of wine, 2 Shots of liquor per week    Comment: 1-2 glasses of wine approx 4 days a week   Drug use: No   Sexual activity: Yes    Birth control/protection: Post-menopausal  Other Topics Concern   Not on file  Social History Narrative   Not on file   Social Determinants of Health   Financial Resource Strain: Not on file  Food Insecurity: Not on file  Transportation Needs: Not on file  Physical Activity: Not on file  Stress: Not on file  Social Connections: Not on file  Intimate Partner Violence: Not on file    Review of Systems  13 point review of systems per patient health survey noted.  Negative other than as indicated above or in HPI.   Objective:   Vitals:   03/03/22 1405  BP: 124/66  Pulse: (!) 58  Resp: 15  Temp: 98 F (36.7 C)  TempSrc: Oral  SpO2: 98%  Weight: 161 lb 9.6 oz (73.3 kg)  Height: 5' 7"  (1.702 m)     Physical Exam Vitals reviewed.  Constitutional:      Appearance: She is well-developed.  HENT:     Head: Normocephalic and atraumatic.     Right Ear: External ear normal.     Left Ear: External ear normal.  Eyes:     Conjunctiva/sclera: Conjunctivae normal.     Pupils: Pupils are equal, round, and reactive to light.  Neck:     Thyroid: No thyromegaly.  Cardiovascular:     Rate and Rhythm: Normal rate and regular rhythm.     Heart sounds: Normal heart sounds. No murmur heard. Pulmonary:     Effort: Pulmonary effort is normal. No  respiratory distress.     Breath sounds: Normal breath sounds. No wheezing.  Abdominal:     General: Bowel sounds are normal.     Palpations: Abdomen is soft.     Tenderness: There is no abdominal tenderness.  Musculoskeletal:        General: No tenderness. Normal  range of motion.     Cervical back: Normal range of motion and neck supple.  Lymphadenopathy:     Cervical: No cervical adenopathy.  Skin:    General: Skin is warm and dry.     Findings: No rash.  Neurological:     Mental Status: She is alert and oriented to person, place, and time.  Psychiatric:        Behavior: Behavior normal.        Thought Content: Thought content normal.        Assessment & Plan:  Sharon Hayes is a 61 y.o. female . Annual physical exam - Plan: Varicella-zoster vaccine IM, Comprehensive metabolic panel, Lipid panel, Hemoglobin A1c  - -anticipatory guidance as below in AVS, screening labs above. Health maintenance items as above in HPI discussed/recommended as applicable.   Screening for diabetes mellitus - Plan: Comprehensive metabolic panel, Hemoglobin A1c  Screening for hyperlipidemia - Plan: Comprehensive metabolic panel, Lipid panel Family history of heart disease - Plan: Lipid panel  -Check lipids, option of coronary calcium scoring at some point if borderline ASCVD risk score given her family history to help determine need for statin.  Need for shingles vaccine - Plan: Varicella-zoster vaccine IM given, repeat in 2 to 6 months  Screening for cervical cancer - Plan: Ambulatory referral to Gynecology No orders of the defined types were placed in this encounter.  Patient Instructions  I placed a referral to gynecology.  Let me know if you do not hear from their office in the next few weeks.  First shingles vaccine given today.  Second vaccine should be within 2 to 6 month window.  If any concerns on labs I will let you know.  Those will be sent by MyChart.  Please let me know if there  are questions.   Preventive Care 41-47 Years Old, Female Preventive care refers to lifestyle choices and visits with your health care provider that can promote health and wellness. Preventive care visits are also called wellness exams. What can I expect for my preventive care visit? Counseling Your health care provider may ask you questions about your: Medical history, including: Past medical problems. Family medical history. Pregnancy history. Current health, including: Menstrual cycle. Method of birth control. Emotional well-being. Home life and relationship well-being. Sexual activity and sexual health. Lifestyle, including: Alcohol, nicotine or tobacco, and drug use. Access to firearms. Diet, exercise, and sleep habits. Work and work Statistician. Sunscreen use. Safety issues such as seatbelt and bike helmet use. Physical exam Your health care provider will check your: Height and weight. These may be used to calculate your BMI (body mass index). BMI is a measurement that tells if you are at a healthy weight. Waist circumference. This measures the distance around your waistline. This measurement also tells if you are at a healthy weight and may help predict your risk of certain diseases, such as type 2 diabetes and high blood pressure. Heart rate and blood pressure. Body temperature. Skin for abnormal spots. What immunizations do I need?  Vaccines are usually given at various ages, according to a schedule. Your health care provider will recommend vaccines for you based on your age, medical history, and lifestyle or other factors, such as travel or where you work. What tests do I need? Screening Your health care provider may recommend screening tests for certain conditions. This may include: Lipid and cholesterol levels. Diabetes screening. This is done by checking your blood sugar (glucose) after you have not eaten  for a while (fasting). Pelvic exam and Pap test. Hepatitis  B test. Hepatitis C test. HIV (human immunodeficiency virus) test. STI (sexually transmitted infection) testing, if you are at risk. Lung cancer screening. Colorectal cancer screening. Mammogram. Talk with your health care provider about when you should start having regular mammograms. This may depend on whether you have a family history of breast cancer. BRCA-related cancer screening. This may be done if you have a family history of breast, ovarian, tubal, or peritoneal cancers. Bone density scan. This is done to screen for osteoporosis. Talk with your health care provider about your test results, treatment options, and if necessary, the need for more tests. Follow these instructions at home: Eating and drinking  Eat a diet that includes fresh fruits and vegetables, whole grains, lean protein, and low-fat dairy products. Take vitamin and mineral supplements as recommended by your health care provider. Do not drink alcohol if: Your health care provider tells you not to drink. You are pregnant, may be pregnant, or are planning to become pregnant. If you drink alcohol: Limit how much you have to 0-1 drink a day. Know how much alcohol is in your drink. In the [place] one drink equals one 12 oz bottle of beer (355 mL), one 5 oz glass of wine (148 mL), or one 1 oz glass of hard liquor (44 mL). Lifestyle Brush your teeth every morning and night with fluoride toothpaste. Floss one time each day. Exercise for at least 30 minutes 5 or more days each week. Do not use any products that contain nicotine or tobacco. These products include cigarettes, chewing tobacco, and vaping devices, such as e-cigarettes. If you need help quitting, ask your health care provider. Do not use drugs. If you are sexually active, practice safe sex. Use a condom or other form of protection to prevent STIs. If you do not wish to become pregnant, use a form of birth control. If you plan to become pregnant, see your health  care provider for a prepregnancy visit. Take aspirin only as told by your health care provider. Make sure that you understand how much to take and what form to take. Work with your health care provider to find out whether it is safe and beneficial for you to take aspirin daily. Find healthy ways to manage stress, such as: Meditation, yoga, or listening to music. Journaling. Talking to a trusted person. Spending time with friends and family. Minimize exposure to UV radiation to reduce your risk of skin cancer. Safety Always wear your seat belt while driving or riding in a vehicle. Do not drive: If you have been drinking alcohol. Do not ride with someone who has been drinking. When you are tired or distracted. While texting. If you have been using any mind-altering substances or drugs. Wear a helmet and other protective equipment during sports activities. If you have firearms in your house, make sure you follow all gun safety procedures. Seek help if you have been physically or sexually abused. What's next? Visit your health care provider once a year for an annual wellness visit. Ask your health care provider how often you should have your eyes and teeth checked. Stay up to date on all vaccines. This information is not intended to replace advice given to you by your health care provider. Make sure you discuss any questions you have with your health care provider. Document Revised: 12/26/2020 Document Reviewed: 12/26/2020 Elsevier Patient Education  [place].     Signed,   Dellis Filbert  Carlota Raspberry, [place], [facility] 03/03/22 2:55 PM

## 2022-03-03 NOTE — Patient Instructions (Signed)
I placed a referral to gynecology.  Let me know if you do not hear from their office in the next few weeks.  First shingles vaccine given today.  Second vaccine should be within 2 to 6 month window.  If any concerns on labs I will let you know.  Those will be sent by MyChart.  Please let me know if there are questions.   Preventive Care 98-61 Years Old, Female Preventive care refers to lifestyle choices and visits with your health care provider that can promote health and wellness. Preventive care visits are also called wellness exams. What can I expect for my preventive care visit? Counseling Your health care provider may ask you questions about your: Medical history, including: Past medical problems. Family medical history. Pregnancy history. Current health, including: Menstrual cycle. Method of birth control. Emotional well-being. Home life and relationship well-being. Sexual activity and sexual health. Lifestyle, including: Alcohol, nicotine or tobacco, and drug use. Access to firearms. Diet, exercise, and sleep habits. Work and work Statistician. Sunscreen use. Safety issues such as seatbelt and bike helmet use. Physical exam Your health care provider will check your: Height and weight. These may be used to calculate your BMI (body mass index). BMI is a measurement that tells if you are at a healthy weight. Waist circumference. This measures the distance around your waistline. This measurement also tells if you are at a healthy weight and may help predict your risk of certain diseases, such as type 2 diabetes and high blood pressure. Heart rate and blood pressure. Body temperature. Skin for abnormal spots. What immunizations do I need?  Vaccines are usually given at various ages, according to a schedule. Your health care provider will recommend vaccines for you based on your age, medical history, and lifestyle or other factors, such as travel or where you work. What tests do I  need? Screening Your health care provider may recommend screening tests for certain conditions. This may include: Lipid and cholesterol levels. Diabetes screening. This is done by checking your blood sugar (glucose) after you have not eaten for a while (fasting). Pelvic exam and Pap test. Hepatitis B test. Hepatitis C test. HIV (human immunodeficiency virus) test. STI (sexually transmitted infection) testing, if you are at risk. Lung cancer screening. Colorectal cancer screening. Mammogram. Talk with your health care provider about when you should start having regular mammograms. This may depend on whether you have a family history of breast cancer. BRCA-related cancer screening. This may be done if you have a family history of breast, ovarian, tubal, or peritoneal cancers. Bone density scan. This is done to screen for osteoporosis. Talk with your health care provider about your test results, treatment options, and if necessary, the need for more tests. Follow these instructions at home: Eating and drinking  Eat a diet that includes fresh fruits and vegetables, whole grains, lean protein, and low-fat dairy products. Take vitamin and mineral supplements as recommended by your health care provider. Do not drink alcohol if: Your health care provider tells you not to drink. You are pregnant, may be pregnant, or are planning to become pregnant. If you drink alcohol: Limit how much you have to 0-1 drink a day. Know how much alcohol is in your drink. In the [place] one drink equals one 12 oz bottle of beer (355 mL), one 5 oz glass of wine (148 mL), or one 1 oz glass of hard liquor (44 mL). Lifestyle Brush your teeth every morning and night with fluoride toothpaste. Floss  one time each day. Exercise for at least 30 minutes 5 or more days each week. Do not use any products that contain nicotine or tobacco. These products include cigarettes, chewing tobacco, and vaping devices, such as  e-cigarettes. If you need help quitting, ask your health care provider. Do not use drugs. If you are sexually active, practice safe sex. Use a condom or other form of protection to prevent STIs. If you do not wish to become pregnant, use a form of birth control. If you plan to become pregnant, see your health care provider for a prepregnancy visit. Take aspirin only as told by your health care provider. Make sure that you understand how much to take and what form to take. Work with your health care provider to find out whether it is safe and beneficial for you to take aspirin daily. Find healthy ways to manage stress, such as: Meditation, yoga, or listening to music. Journaling. Talking to a trusted person. Spending time with friends and family. Minimize exposure to UV radiation to reduce your risk of skin cancer. Safety Always wear your seat belt while driving or riding in a vehicle. Do not drive: If you have been drinking alcohol. Do not ride with someone who has been drinking. When you are tired or distracted. While texting. If you have been using any mind-altering substances or drugs. Wear a helmet and other protective equipment during sports activities. If you have firearms in your house, make sure you follow all gun safety procedures. Seek help if you have been physically or sexually abused. What's next? Visit your health care provider once a year for an annual wellness visit. Ask your health care provider how often you should have your eyes and teeth checked. Stay up to date on all vaccines. This information is not intended to replace advice given to you by your health care provider. Make sure you discuss any questions you have with your health care provider. Document Revised: 12/26/2020 Document Reviewed: 12/26/2020 Elsevier Patient Education  [place].

## 2022-03-11 ENCOUNTER — Other Ambulatory Visit (INDEPENDENT_AMBULATORY_CARE_PROVIDER_SITE_OTHER): Payer: No Typology Code available for payment source

## 2022-03-11 DIAGNOSIS — Z0000 Encounter for general adult medical examination without abnormal findings: Secondary | ICD-10-CM

## 2022-03-11 DIAGNOSIS — Z131 Encounter for screening for diabetes mellitus: Secondary | ICD-10-CM

## 2022-03-11 DIAGNOSIS — Z8249 Family history of ischemic heart disease and other diseases of the circulatory system: Secondary | ICD-10-CM

## 2022-03-11 DIAGNOSIS — Z13220 Encounter for screening for lipoid disorders: Secondary | ICD-10-CM

## 2022-03-11 LAB — HEMOGLOBIN A1C: Hgb A1c MFr Bld: 5.7 % (ref 4.6–6.5)

## 2022-03-11 LAB — COMPREHENSIVE METABOLIC PANEL
ALT: 9 U/L (ref 0–35)
AST: 17 U/L (ref 0–37)
Albumin: 4.3 g/dL (ref 3.5–5.2)
Alkaline Phosphatase: 64 U/L (ref 39–117)
BUN: 18 mg/dL (ref 6–23)
CO2: 27 mEq/L (ref 19–32)
Calcium: 9.7 mg/dL (ref 8.4–10.5)
Chloride: 104 mEq/L (ref 96–112)
Creatinine, Ser: 1.04 mg/dL (ref 0.40–1.20)
GFR: 58.16 mL/min — ABNORMAL LOW (ref >60.00)
Glucose, Bld: 75 mg/dL (ref 70–99)
Potassium: 4.5 mEq/L (ref 3.5–5.1)
Sodium: 139 mEq/L (ref 135–145)
Total Bilirubin: 0.4 mg/dL (ref 0.2–1.2)
Total Protein: 7.2 g/dL (ref 6.0–8.3)

## 2022-03-11 LAB — LIPID PANEL
Cholesterol: 252 mg/dL — ABNORMAL HIGH (ref 0–200)
HDL: 75.5 mg/dL (ref >39.00)
LDL Cholesterol: 167 mg/dL — ABNORMAL HIGH (ref 0–99)
NonHDL: 176.31
Total CHOL/HDL Ratio: 3
Triglycerides: 46 mg/dL (ref 0.0–149.0)
VLDL: 9.2 mg/dL (ref 0.0–40.0)

## 2022-04-22 LAB — HM PAP SMEAR: HM Pap smear: NEGATIVE

## 2022-05-23 ENCOUNTER — Other Ambulatory Visit (HOSPITAL_COMMUNITY): Payer: Self-pay | Admitting: Family Medicine

## 2022-05-23 ENCOUNTER — Telehealth: Payer: No Typology Code available for payment source | Admitting: Physician Assistant

## 2022-05-23 DIAGNOSIS — R3989 Other symptoms and signs involving the genitourinary system: Secondary | ICD-10-CM | POA: Diagnosis not present

## 2022-05-23 DIAGNOSIS — Z1231 Encounter for screening mammogram for malignant neoplasm of breast: Secondary | ICD-10-CM

## 2022-05-23 MED ORDER — CEPHALEXIN 500 MG PO CAPS: 500.0000 mg | ORAL_CAPSULE | Freq: Two times a day (BID) | ORAL | 0 refills | Status: DC

## 2022-05-23 NOTE — Progress Notes (Signed)

## 2022-05-26 ENCOUNTER — Ambulatory Visit (INDEPENDENT_AMBULATORY_CARE_PROVIDER_SITE_OTHER): Payer: No Typology Code available for payment source | Admitting: Family Medicine

## 2022-05-26 DIAGNOSIS — Z23 Encounter for immunization: Secondary | ICD-10-CM

## 2022-05-26 NOTE — Progress Notes (Signed)
Pt came in for her second Shingles vaccine today . Gave in right deltoid and pt tolerated well . Side effects explained  to  pt as well

## 2022-05-27 ENCOUNTER — Encounter: Payer: No Typology Code available for payment source | Admitting: Registered Nurse

## 2022-06-02 ENCOUNTER — Ambulatory Visit (HOSPITAL_COMMUNITY)
Admission: RE | Admit: 2022-06-02 | Discharge: 2022-06-02 | Disposition: A | Discharge status: Home / Self Care | Payer: No Typology Code available for payment source | Source: Ambulatory Visit | Attending: Family Medicine | Admitting: Family Medicine

## 2022-06-02 DIAGNOSIS — Z1231 Encounter for screening mammogram for malignant neoplasm of breast: Secondary | ICD-10-CM | POA: Insufficient documentation

## 2022-12-31 ENCOUNTER — Telehealth: Payer: 59 | Admitting: Physician Assistant

## 2022-12-31 DIAGNOSIS — R3989 Other symptoms and signs involving the genitourinary system: Secondary | ICD-10-CM

## 2022-12-31 MED ORDER — CEPHALEXIN 500 MG PO CAPS: 500.0000 mg | ORAL_CAPSULE | Freq: Two times a day (BID) | ORAL | 0 refills | Status: AC

## 2022-12-31 NOTE — Progress Notes (Signed)

## 2022-12-31 NOTE — Progress Notes (Signed)
I have spent 5 minutes in review of e-visit questionnaire, review and updating patient chart, medical decision making and response to patient.   Ermelinda Eckert Cody Rashed Edler, PA-C    

## 2023-03-24 ENCOUNTER — Ambulatory Visit (INDEPENDENT_AMBULATORY_CARE_PROVIDER_SITE_OTHER): Payer: 59 | Admitting: Family Medicine

## 2023-03-24 ENCOUNTER — Encounter: Payer: Self-pay | Admitting: Family Medicine

## 2023-03-24 VITALS — BP 102/62 | HR 50 | Temp 97.8°F | Ht 67.0 in | Wt 164.1 lb

## 2023-03-24 DIAGNOSIS — Z0000 Encounter for general adult medical examination without abnormal findings: Secondary | ICD-10-CM

## 2023-03-24 DIAGNOSIS — Z13220 Encounter for screening for lipoid disorders: Secondary | ICD-10-CM

## 2023-03-24 DIAGNOSIS — H6122 Impacted cerumen, left ear: Secondary | ICD-10-CM | POA: Diagnosis not present

## 2023-03-24 DIAGNOSIS — Z114 Encounter for screening for human immunodeficiency virus [HIV]: Secondary | ICD-10-CM | POA: Diagnosis not present

## 2023-03-24 DIAGNOSIS — Z1159 Encounter for screening for other viral diseases: Secondary | ICD-10-CM

## 2023-03-24 DIAGNOSIS — Z7689 Persons encountering health services in other specified circumstances: Secondary | ICD-10-CM

## 2023-03-24 NOTE — Progress Notes (Signed)
New Patient Office Visit/Physical Exam   Subjective    Patient ID: Sharon Hayes, female    DOB: 1960-09-28  Age: 62 y.o. MRN: [id number]  CC:  Chief Complaint  Patient presents with   Establish Care    Pt is here to Est Care Pt reports she is not FASTING Pt reports no concerns today. Pt reports she had PCP as Rochele Pages    HPI Sharon Hayes presents to establish care with new provider and have annual physical exam.   Patients previous primary care provider was Janeece Agee, NP. Last visit was 09/02/2021. Seen Dr. Irine Seal for a physical 03/03/2022.   Specialist: [facility] with Dr. Zelphia Cairo.  [facility] with Dr. Hartley Barefoot   Diet: General Diet Exercise: 20 minute walk, 7 days a week; 3 times strength training class  Vision: Year eye exam, had a little over a year ago Dental care: Receives regular dental care.   Patient has no concerns.   No outpatient encounter medications on file as of 03/24/2023.   No facility-administered encounter medications on file as of 03/24/2023.    Past Medical History:  Diagnosis Date   Colon polyps    Complication of anesthesia    Hard to wake up.   Varicose veins     Past Surgical History:  Procedure Laterality Date   COLONOSCOPY  11/24/07   WGN:FAOZHY polyp at 15 cm anal verge otherwise normal   COLONOSCOPY N/A 01/29/2015   Procedure: COLONOSCOPY;  Surgeon: Corbin Ade, MD;  Location: [facility];  Service: Endoscopy;  Laterality: N/A;  0730   COLONOSCOPY N/A 05/22/2021   Procedure: COLONOSCOPY;  Surgeon: Corbin Ade, MD;  Location: [facility];  Service: Endoscopy;  Laterality: N/A;  7:30 / ASA I   ENDOVENOUS ABLATION SAPHENOUS VEIN W/ LASER Left 05-17-2015   endovenous laser ablation left greater saphenous vein and stab phlebectomy 10-20 incisions left leg by Gretta Began MD    ORIF WRIST FRACTURE Left 11/09/2018   Procedure: OPEN REDUCTION INTERNAL FIXATION (ORIF)  WRIST FRACTURE;  Surgeon: Dominica Severin, MD;  Location: [facility];  Service: Orthopedics;  Laterality: Left;   PELVIC LAPAROSCOPY     POLYPECTOMY  05/22/2021   Procedure: POLYPECTOMY;  Surgeon: Corbin Ade, MD;  Location: [facility];  Service: Endoscopy;;    Family History  Problem Relation Age of Onset   Colon cancer Mother 91       colon   Cancer Mother    Varicose Veins Mother    Heart disease Father    Hyperlipidemia Father    Hypertension Father    Heart attack Father    CVA Father    Colon polyps Sister    Cancer Maternal Aunt        uterine   Breast cancer Maternal Aunt     Social History   Socioeconomic History   Marital status: Married    Spouse name: Not on file   Number of children: 3   Years of education: Not on file   Highest education level: Bachelor's degree (e.g., BA, AB, BS)  Occupational History   Occupation: PT  Tobacco Use   Smoking status: Never   Smokeless tobacco: Never  Vaping Use   Vaping status: Never Used  Substance and Sexual Activity   Alcohol use: Yes    Alcohol/week: 9.0 standard drinks of alcohol    Types: 7 Glasses of wine, 2 Shots of  liquor per week    Comment: 1-2 glasses of wine approx 4 days a week   Drug use: No   Sexual activity: Yes    Birth control/protection: Post-menopausal  Other Topics Concern   Not on file  Social History Narrative   Not on file   Social Determinants of Health   Financial Resource Strain: Low Risk  (03/24/2023)   Overall Financial Resource Strain (CARDIA)    Difficulty of Paying Living Expenses: Not hard at all  Food Insecurity: No Food Insecurity (03/24/2023)   Hunger Vital Sign    Worried About Running Out of Food in the Last Year: Never true    Ran Out of Food in the Last Year: Never true  Transportation Needs: No Transportation Needs (03/24/2023)   PRAPARE - Administrator, Civil Service (Medical): No    Lack of Transportation (Non-Medical): No  Physical Activity:  Sufficiently Active (03/24/2023)   Exercise Vital Sign    Days of Exercise per Week: 7 days    Minutes of Exercise per Session: 30 min  Stress: No Stress Concern Present (03/24/2023)   [employer] of Occupational Health - Occupational Stress Questionnaire    Feeling of Stress : Not at all  Social Connections: Moderately Integrated (03/24/2023)   Social Connection and Isolation Panel [NHANES]    Frequency of Communication with Friends and Family: More than three times a week    Frequency of Social Gatherings with Friends and Family: Once a week    Attends Religious Services: Never    Database administrator or Organizations: Yes    Attends Banker Meetings: 1 to 4 times per year    Marital Status: Married  Catering manager Violence: Not At Risk (03/24/2023)   Humiliation, Afraid, Rape, and Kick questionnaire    Fear of Current or Ex-Partner: No    Emotionally Abused: No    Physically Abused: No    Sexually Abused: No    ROS See HPI above    Objective   BP 102/62   Pulse (!) 50   Temp 97.8 F (36.6 C)   Ht 5\' 7"  (1.702 m)   Wt 164 lb 2 oz (74.4 kg)   LMP  (LMP Unknown)   SpO2 96%   BMI 25.71 kg/m   Physical Exam Vitals reviewed.  Constitutional:      General: She is not in acute distress.    Appearance: Normal appearance. She is not ill-appearing, toxic-appearing or diaphoretic.  HENT:     Head: Normocephalic and atraumatic.     Right Ear: Tympanic membrane, ear canal and external ear normal. There is no impacted cerumen.     Left Ear: There is impacted cerumen.     Mouth/Throat:     Mouth: Mucous membranes are moist.     Pharynx: Oropharynx is clear. Uvula midline. No pharyngeal swelling, oropharyngeal exudate, posterior oropharyngeal erythema, uvula swelling or postnasal drip.  Eyes:     General:        Right eye: No discharge.        Left eye: No discharge.     Conjunctiva/sclera: Conjunctivae normal.     Pupils: Pupils are equal, round, and  reactive to light.  Neck:     Thyroid: No thyromegaly.  Cardiovascular:     Rate and Rhythm: Regular rhythm. Bradycardia present.     Pulses:          Dorsalis pedis pulses are 3+ on the right side and 3+  on the left side.     Heart sounds: Normal heart sounds. No murmur heard.    No friction rub. No gallop.  Pulmonary:     Effort: Pulmonary effort is normal. No respiratory distress.     Breath sounds: Normal breath sounds.  Abdominal:     General: Abdomen is flat. Bowel sounds are normal. There is no distension.     Palpations: Abdomen is soft.     Tenderness: There is no abdominal tenderness. There is no right CVA tenderness, left CVA tenderness or guarding.  Musculoskeletal:        General: Normal range of motion.     Cervical back: Normal range of motion. No tenderness.     Thoracic back: No tenderness.     Lumbar back: No tenderness.     Right lower leg: No edema.     Left lower leg: No edema.  Lymphadenopathy:     Head:     Right side of head: No submental or submandibular adenopathy.     Left side of head: No submental or submandibular adenopathy.     Cervical: No cervical adenopathy.  Skin:    General: Skin is warm and dry.  Neurological:     General: No focal deficit present.     Mental Status: She is alert and oriented to person, place, and time. Mental status is at baseline.     Motor: No weakness.     Gait: Gait normal.  Psychiatric:        Mood and Affect: Mood normal.        Behavior: Behavior normal. Behavior is cooperative.        Thought Content: Thought content normal.        Judgment: Judgment normal.      Assessment & Plan:  Annual physical exam -     TSH; Future -     CBC with Differential/Platelet; Future -     Comprehensive metabolic panel; Future  Need for hepatitis C screening test -     Hepatitis C antibody; Future  Encounter for screening for HIV -     HIV Antibody (routine testing w rflx); Future  Screening for hyperlipidemia -      Lipid panel; Future  Impacted cerumen of left ear  Encounter to establish care  1.Review health maintenance:  -Covid vaccines/boosters: Declines additional boosters. -Tdap: [facility] with [facility]  -Hep C screening: Ordered  -Influenza vaccine: Wait for influenza clinic with employer  -Pap smear: Last 05/2022; request records -HIV screening: Ordered  -PNA vaccine: Declines  2.Physical exam completed, with left ear impaction. -Recommend to use over the counter Debrox to cleanse ear canal. Declined ear lavage. 3.Ordered screening labs. Patient is not fasting today and will make a lab visit for a morning when she is fasting. (Labs: CBC with Diff, CMP, lipid, and TSH)  4.Continue with healthy diet and regular exercise.  Return in about 1 year (around 03/23/2024) for physical; please cancel physical appt already scheduled.   Zandra Abts, NP

## 2023-03-24 NOTE — Patient Instructions (Signed)
-  It was pleasure to meet you and look forward to taking care of you.  -Physical exam completed, with left ear impaction. -Recommend to use over the counter Debrox to cleanse ear canal. Declined ear lavage. -Ordered screening labs. Patient is not fasting today and will make a lab visit for a morning when she is fasting.  -Continue with healthy diet and regular exercise. -Follow up in 1 year for physical exam.

## 2023-03-30 ENCOUNTER — Other Ambulatory Visit (INDEPENDENT_AMBULATORY_CARE_PROVIDER_SITE_OTHER): Payer: 59

## 2023-03-30 ENCOUNTER — Telehealth: Payer: Self-pay

## 2023-03-30 DIAGNOSIS — Z114 Encounter for screening for human immunodeficiency virus [HIV]: Secondary | ICD-10-CM

## 2023-03-30 DIAGNOSIS — Z0000 Encounter for general adult medical examination without abnormal findings: Secondary | ICD-10-CM

## 2023-03-30 DIAGNOSIS — Z1159 Encounter for screening for other viral diseases: Secondary | ICD-10-CM

## 2023-03-30 DIAGNOSIS — Z13220 Encounter for screening for lipoid disorders: Secondary | ICD-10-CM | POA: Diagnosis not present

## 2023-03-30 LAB — CBC WITH DIFFERENTIAL/PLATELET
Basophils Absolute: 0 10*3/uL (ref 0.0–0.1)
Basophils Relative: 1.1 % (ref 0.0–3.0)
Eosinophils Absolute: 0.1 10*3/uL (ref 0.0–0.7)
Eosinophils Relative: 3.3 % (ref 0.0–5.0)
HCT: 39.6 % (ref 36.0–46.0)
Hemoglobin: 12.9 g/dL (ref 12.0–15.0)
Lymphocytes Relative: 38.5 % (ref 12.0–46.0)
Lymphs Abs: 1.5 10*3/uL (ref 0.7–4.0)
MCHC: 32.5 g/dL (ref 30.0–36.0)
MCV: 94.3 fl (ref 78.0–100.0)
Monocytes Absolute: 0.3 10*3/uL (ref 0.1–1.0)
Monocytes Relative: 8.3 % (ref 3.0–12.0)
Neutro Abs: 1.9 10*3/uL (ref 1.4–7.7)
Neutrophils Relative %: 48.8 % (ref 43.0–77.0)
Platelets: 185 10*3/uL (ref 150.0–400.0)
RBC: 4.2 Mil/uL (ref 3.87–5.11)
RDW: 14 % (ref 11.5–15.5)
WBC: 3.9 10*3/uL — ABNORMAL LOW (ref 4.0–10.5)

## 2023-03-30 LAB — COMPREHENSIVE METABOLIC PANEL
ALT: 10 U/L (ref 0–35)
AST: 16 U/L (ref 0–37)
Albumin: 3.9 g/dL (ref 3.5–5.2)
Alkaline Phosphatase: 59 U/L (ref 39–117)
BUN: 17 mg/dL (ref 6–23)
CO2: 24 meq/L (ref 19–32)
Calcium: 9.2 mg/dL (ref 8.4–10.5)
Chloride: 104 meq/L (ref 96–112)
Creatinine, Ser: 0.91 mg/dL (ref 0.40–1.20)
GFR: 67.77 mL/min (ref >60.00)
Glucose, Bld: 79 mg/dL (ref 70–99)
Potassium: 4.1 meq/L (ref 3.5–5.1)
Sodium: 137 meq/L (ref 135–145)
Total Bilirubin: 0.5 mg/dL (ref 0.2–1.2)
Total Protein: 6.6 g/dL (ref 6.0–8.3)

## 2023-03-30 LAB — LIPID PANEL
Cholesterol: 229 mg/dL — ABNORMAL HIGH (ref 0–200)
HDL: 70.5 mg/dL (ref >39.00)
LDL Cholesterol: 148 mg/dL — ABNORMAL HIGH (ref 0–99)
NonHDL: 158.12
Total CHOL/HDL Ratio: 3
Triglycerides: 52 mg/dL (ref 0.0–149.0)
VLDL: 10.4 mg/dL (ref 0.0–40.0)

## 2023-03-30 LAB — TSH: TSH: 1.85 u[IU]/mL (ref 0.35–5.50)

## 2023-03-30 NOTE — Telephone Encounter (Signed)
-----   Message from Zandra Abts sent at 03/30/2023  1:26 PM EDT ----- Your total cholesterol and bad (LDL) cholesterol have improved since last year, but still slightly elevated. Recommend diet modifications and exercise - increase intake of fresh fruits and vegetables, increase intake of lean proteins. Bake, broil, or grill foods. Avoid fried, greasy, and fatty foods. Avoid fast foods. Increase intake of fiber-rich whole grains. Exercise encouraged - at least 150 minutes per week and advance as tolerated. Cholesterol is not high enough for the need of medication management. All other labs are stable.

## 2023-03-30 NOTE — Telephone Encounter (Signed)
Pt has reviewed MyChart

## 2023-03-31 ENCOUNTER — Telehealth: Payer: Self-pay

## 2023-03-31 LAB — HIV ANTIBODY (ROUTINE TESTING W REFLEX): HIV 1&2 Ab, 4th Generation: NONREACTIVE

## 2023-03-31 LAB — HEPATITIS C ANTIBODY: Hepatitis C Ab: NONREACTIVE

## 2023-03-31 NOTE — Telephone Encounter (Signed)
-----   Message from Zandra Abts sent at 03/31/2023  4:18 PM EDT ----- HIV and Hep C are negative.

## 2023-06-02 ENCOUNTER — Encounter: Payer: 59 | Admitting: Family Medicine

## 2023-07-07 ENCOUNTER — Telehealth: Payer: 59 | Admitting: Physician Assistant

## 2023-07-07 DIAGNOSIS — R3989 Other symptoms and signs involving the genitourinary system: Secondary | ICD-10-CM

## 2023-07-07 MED ORDER — CEPHALEXIN 500 MG PO CAPS: 500.0000 mg | ORAL_CAPSULE | Freq: Two times a day (BID) | ORAL | 0 refills | Status: AC

## 2023-07-07 NOTE — Progress Notes (Signed)

## 2023-07-07 NOTE — Progress Notes (Signed)
I have spent 5 minutes in review of e-visit questionnaire, review and updating patient chart, medical decision making and response to patient.   Mia Milan Cody Jacklynn Dehaas, PA-C    

## 2023-08-25 DIAGNOSIS — Z6825 Body mass index (BMI) 25.0-25.9, adult: Secondary | ICD-10-CM | POA: Diagnosis not present

## 2023-08-25 DIAGNOSIS — Z01419 Encounter for gynecological examination (general) (routine) without abnormal findings: Secondary | ICD-10-CM | POA: Diagnosis not present

## 2023-08-25 DIAGNOSIS — Z1231 Encounter for screening mammogram for malignant neoplasm of breast: Secondary | ICD-10-CM | POA: Diagnosis not present

## 2023-10-20 ENCOUNTER — Telehealth: Admitting: Physician Assistant

## 2023-10-20 DIAGNOSIS — R3989 Other symptoms and signs involving the genitourinary system: Secondary | ICD-10-CM | POA: Diagnosis not present

## 2023-10-20 MED ORDER — CEPHALEXIN 500 MG PO CAPS: 500.0000 mg | ORAL_CAPSULE | Freq: Two times a day (BID) | ORAL | 0 refills | Status: AC

## 2023-10-20 NOTE — Progress Notes (Signed)

## 2023-10-20 NOTE — Progress Notes (Signed)
 I have spent 5 minutes in review of e-visit questionnaire, review and updating patient chart, medical decision making and response to patient.   Piedad Climes, PA-C

## 2023-11-23 DIAGNOSIS — Z13820 Encounter for screening for osteoporosis: Secondary | ICD-10-CM | POA: Diagnosis not present

## 2024-02-22 DIAGNOSIS — M8580 Other specified disorders of bone density and structure, unspecified site: Secondary | ICD-10-CM | POA: Diagnosis not present

## 2024-02-22 DIAGNOSIS — N6311 Unspecified lump in the right breast, upper outer quadrant: Secondary | ICD-10-CM | POA: Diagnosis not present

## 2024-02-23 ENCOUNTER — Other Ambulatory Visit: Payer: Self-pay | Admitting: Obstetrics and Gynecology

## 2024-02-23 DIAGNOSIS — N6311 Unspecified lump in the right breast, upper outer quadrant: Secondary | ICD-10-CM

## 2024-03-07 ENCOUNTER — Ambulatory Visit
Admission: RE | Admit: 2024-03-07 | Discharge: 2024-03-07 | Disposition: A | Discharge status: Home / Self Care | Source: Ambulatory Visit | Attending: Obstetrics and Gynecology | Admitting: Obstetrics and Gynecology

## 2024-03-07 DIAGNOSIS — N6311 Unspecified lump in the right breast, upper outer quadrant: Secondary | ICD-10-CM

## 2024-03-07 DIAGNOSIS — R928 Other abnormal and inconclusive findings on diagnostic imaging of breast: Secondary | ICD-10-CM | POA: Diagnosis not present

## 2024-04-19 ENCOUNTER — Ambulatory Visit: Payer: Self-pay

## 2024-04-19 NOTE — Telephone Encounter (Signed)
 FYI Only or Action Required?: FYI only for provider.  Patient was last seen in primary care on 03/24/2023 by Billy Philippe SAUNDERS, NP.  Called Nurse Triage reporting Constipation.  Symptoms began Friday.  Interventions attempted: Nothing.  Symptoms are: gradually worsening.  Triage Disposition: See PCP When Office is Open (Within 3 Days)  Patient/caregiver understands and will follow disposition?: Yes    Patient called in regarding needing to speak with a nurse, stated dog came down with worms, stated she is having GI problems, not sure if she need to get a lab or what stated she feels bloated or should she go to her GI doctor regarding this   [phone]  Reason for Disposition  Unable to have a bowel movement (BM) without manually removing stool (using finger to pull out stool or perform disimpaction)  Answer Assessment - Initial Assessment Questions 1. STOOL PATTERN OR FREQUENCY: How often do you have a bowel movement (BM)?  (Normal range: 3 times a day to every 3 days)  When was your last BM?   Normal every couple of days 2. STRAINING: Do you have to strain to have a BM?      no 3. ONSET: When did the constipation begin?     Friday 4. RECTAL PAIN: Does your rectum hurt when the stool comes out? If Yes, ask: Do you have hemorrhoids? How bad is the pain?  (Scale 1-10; or mild, moderate, severe)     no 5. BM COMPOSITION: Are the stools hard?      yes 6. BLOOD ON STOOLS: Has there been any blood on the toilet tissue or on the surface of the BM? If Yes, ask: When was the last time?     no 7. CHRONIC CONSTIPATION: Is this a new problem for you?  If No, ask: How long have you had this problem? (days, weeks, months)      na 8. CHANGES IN DIET OR HYDRATION: Have there been any recent changes in your diet? How much fluids are you drinking on a daily basis?  How much have you had to drink today?     no 9. MEDICINES: Have you been taking any new  medicines? Are you taking any narcotic pain medicines? (e.g., Dilaudid , morphine, Percocet, Vicodin)     no 10. LAXATIVES: Have you been using any stool softeners, laxatives, or enemas?  If Yes, ask What are you using, how often, and when was the last time?       na 11. ACTIVITY:  How much walking do you do every day?  Has your activity level decreased in the past week?        na 12. CAUSE: What do you think is causing the constipation?        Possible from dog 13. MEDICAL HISTORY: Do you have a history of hemorrhoids, rectal fissures, rectal surgery, or rectal abscess?         na 14. OTHER SYMPTOMS: Do you have any other symptoms? (e.g., abdomen pain, bloating, fever, vomiting)       Abd bloating,  15. PREGNANCY: Is there any chance you are pregnant? When was your last menstrual period?       na  tated dog came down with worms - noticed this last Wednesday & pt is concerned because now she is having rice-like substance in stool and constipation, abd bloating  Protocols used: Constipation-A-AH

## 2024-04-25 ENCOUNTER — Ambulatory Visit: Admitting: Family Medicine

## 2024-05-02 ENCOUNTER — Ambulatory Visit: Admitting: Family Medicine

## 2024-08-01 ENCOUNTER — Encounter: Payer: Self-pay | Admitting: Family Medicine

## 2024-08-01 ENCOUNTER — Ambulatory Visit: Payer: Self-pay | Admitting: Family Medicine

## 2024-08-01 ENCOUNTER — Ambulatory Visit: Admitting: Family Medicine

## 2024-08-01 VITALS — BP 108/70 | HR 48 | Temp 98.4°F | Ht 67.0 in | Wt 156.0 lb

## 2024-08-01 DIAGNOSIS — Z23 Encounter for immunization: Secondary | ICD-10-CM

## 2024-08-01 DIAGNOSIS — Z0000 Encounter for general adult medical examination without abnormal findings: Secondary | ICD-10-CM

## 2024-08-01 DIAGNOSIS — Z13220 Encounter for screening for lipoid disorders: Secondary | ICD-10-CM

## 2024-08-01 LAB — CBC WITH DIFFERENTIAL/PLATELET
Basophils Absolute: 0.1 K/uL (ref 0.0–0.1)
Basophils Relative: 1.2 % (ref 0.0–3.0)
Eosinophils Absolute: 0.1 K/uL (ref 0.0–0.7)
Eosinophils Relative: 2.7 % (ref 0.0–5.0)
HCT: 38.6 % (ref 36.0–46.0)
Hemoglobin: 12.9 g/dL (ref 12.0–15.0)
Lymphocytes Relative: 44.1 % (ref 12.0–46.0)
Lymphs Abs: 2.1 K/uL (ref 0.7–4.0)
MCHC: 33.5 g/dL (ref 30.0–36.0)
MCV: 93 fl (ref 78.0–100.0)
Monocytes Absolute: 0.4 K/uL (ref 0.1–1.0)
Monocytes Relative: 7.9 % (ref 3.0–12.0)
Neutro Abs: 2.1 K/uL (ref 1.4–7.7)
Neutrophils Relative %: 44.1 % (ref 43.0–77.0)
Platelets: 199 K/uL (ref 150.0–400.0)
RBC: 4.15 Mil/uL (ref 3.87–5.11)
RDW: 13.6 % (ref 11.5–15.5)
WBC: 4.7 K/uL (ref 4.0–10.5)

## 2024-08-01 LAB — COMPREHENSIVE METABOLIC PANEL WITH GFR
ALT: 11 U/L (ref 3–35)
AST: 18 U/L (ref 5–37)
Albumin: 4.3 g/dL (ref 3.5–5.2)
Alkaline Phosphatase: 62 U/L (ref 39–117)
BUN: 16 mg/dL (ref 6–23)
CO2: 28 meq/L (ref 19–32)
Calcium: 9.5 mg/dL (ref 8.4–10.5)
Chloride: 104 meq/L (ref 96–112)
Creatinine, Ser: 0.86 mg/dL (ref 0.40–1.20)
GFR: 71.84 mL/min
Glucose, Bld: 79 mg/dL (ref 70–99)
Potassium: 3.8 meq/L (ref 3.5–5.1)
Sodium: 138 meq/L (ref 135–145)
Total Bilirubin: 0.5 mg/dL (ref 0.2–1.2)
Total Protein: 6.9 g/dL (ref 6.0–8.3)

## 2024-08-01 LAB — LIPID PANEL
Cholesterol: 226 mg/dL — ABNORMAL HIGH (ref 28–200)
HDL: 73 mg/dL
LDL Cholesterol: 143 mg/dL — ABNORMAL HIGH (ref 10–99)
NonHDL: 153.03
Total CHOL/HDL Ratio: 3
Triglycerides: 52 mg/dL (ref 10.0–149.0)
VLDL: 10.4 mg/dL (ref 0.0–40.0)

## 2024-08-01 LAB — TSH: TSH: 2.28 u[IU]/mL (ref 0.35–5.50)

## 2024-08-01 NOTE — Patient Instructions (Signed)
-  It was pleasure to care for you today. -Physical exam completed today.  -Continue a healthy diet and regular exercise. -Tdap (tetanus) vaccine provided.  -May use over the counter Debrox every 3-6 months for ear canal cleaning. If unsuccessful, follow up for a ear lavage. -Ordered labs. Office will call with lab results and will be available via MyChart. -Follow up in 1 year for a physical.

## 2024-08-01 NOTE — Progress Notes (Signed)
 "  Complete physical exam  Patient: Sharon Hayes   DOB: 16-Mar-1961   64 y.o. Female  MRN: [id number]  Subjective:    Chief Complaint  Patient presents with   Annual Exam    Sharon Hayes is a 64 y.o. female who presents today for a complete physical exam. She reports consuming a general diet. Exercise: 20-40 minutes 10-20 strength daily. She generally feels well. She reports sleeping well. She does not have additional problems to discuss today.    Most recent fall risk assessment:    08/01/2024   11:02 AM  Fall Risk   Falls in the past year? 0  Number falls in past yr: 0  Injury with Fall? 0  Risk for fall due to : No Fall Risks  Follow up Falls evaluation completed     Most recent depression screenings:    08/01/2024   11:02 AM 03/24/2023    9:52 AM  PHQ 2/9 Scores  PHQ - 2 Score 0 0  PHQ- 9 Score 0 0      Data saved with a previous flowsheet row definition    Vision:Within last year and Dental: No current dental problems and Receives regular dental care  Past Medical History:  Diagnosis Date   Colon polyps    Complication of anesthesia    Hard to wake up.   Varicose veins    Past Surgical History:  Procedure Laterality Date   COLONOSCOPY  11/24/07   MFM:mzrujo polyp at 15 cm anal verge otherwise normal   COLONOSCOPY N/A 01/29/2015   Procedure: COLONOSCOPY;  Surgeon: Lamar CHRISTELLA Hollingshead, MD;  Location: [facility];  Service: Endoscopy;  Laterality: N/A;  0730   COLONOSCOPY N/A 05/22/2021   Procedure: COLONOSCOPY;  Surgeon: Hollingshead Lamar CHRISTELLA, MD;  Location: [facility];  Service: Endoscopy;  Laterality: N/A;  7:30 / ASA I   ENDOVENOUS ABLATION SAPHENOUS VEIN W/ LASER Left 05-17-2015   endovenous laser ablation left greater saphenous vein and stab phlebectomy 10-20 incisions left leg by Krystal Doing MD    ORIF WRIST FRACTURE Left 11/09/2018   Procedure: OPEN REDUCTION INTERNAL FIXATION (ORIF) WRIST FRACTURE;  Surgeon: Camella Fallow, MD;  Location: [facility];   Service: Orthopedics;  Laterality: Left;   PELVIC LAPAROSCOPY     POLYPECTOMY  05/22/2021   Procedure: POLYPECTOMY;  Surgeon: Hollingshead Lamar CHRISTELLA, MD;  Location: [facility];  Service: Endoscopy;;   Social History[1] Social History   Socioeconomic History   Marital status: Married    Spouse name: Not on file   Number of children: 3   Years of education: Not on file   Highest education level: Bachelor's degree (e.g., BA, AB, BS)  Occupational History   Occupation: PT  Tobacco Use   Smoking status: Never   Smokeless tobacco: Never  Vaping Use   Vaping status: Never Used  Substance and Sexual Activity   Alcohol use: Yes    Alcohol/week: 9.0 standard drinks of alcohol    Types: 7 Glasses of wine, 2 Shots of liquor per week    Comment: 1-2 glasses of wine approx 4 days a week   Drug use: No   Sexual activity: Yes    Birth control/protection: Post-menopausal  Other Topics Concern   Not on file  Social History Narrative   Not on file   Social Drivers of Health   Tobacco Use: Low Risk (08/01/2024)   Patient History    Smoking Tobacco Use: Never    Smokeless  Tobacco Use: Never    Passive Exposure: Not on file  Financial Resource Strain: Low Risk (03/24/2023)   Overall Financial Resource Strain (CARDIA)    Difficulty of Paying Living Expenses: Not hard at all  Food Insecurity: No Food Insecurity (03/24/2023)   Hunger Vital Sign    Worried About Running Out of Food in the Last Year: Never true    Ran Out of Food in the Last Year: Never true  Transportation Needs: No Transportation Needs (03/24/2023)   PRAPARE - Administrator, Civil Service (Medical): No    Lack of Transportation (Non-Medical): No  Physical Activity: Sufficiently Active (03/24/2023)   Exercise Vital Sign    Days of Exercise per Week: 7 days    Minutes of Exercise per Session: 30 min  Stress: No Stress Concern Present (03/24/2023)   [employer] of Occupational Health - Occupational Stress  Questionnaire    Feeling of Stress : Not at all  Social Connections: Moderately Integrated (03/24/2023)   Social Connection and Isolation Panel    Frequency of Communication with Friends and Family: More than three times a week    Frequency of Social Gatherings with Friends and Family: Once a week    Attends Religious Services: Never    Database Administrator or Organizations: Yes    Attends Banker Meetings: 1 to 4 times per year    Marital Status: Married  Catering Manager Violence: Not At Risk (03/24/2023)   Humiliation, Afraid, Rape, and Kick questionnaire    Fear of Current or Ex-Partner: No    Emotionally Abused: No    Physically Abused: No    Sexually Abused: No  Depression (PHQ2-9): Low Risk (08/01/2024)   Depression (PHQ2-9)    PHQ-2 Score: 0  Alcohol Screen: Low Risk (03/24/2023)   Alcohol Screen    Last Alcohol Screening Score (AUDIT): 3  Housing: Low Risk (03/24/2023)   Housing    Last Housing Risk Score: 0  Utilities: Not At Risk (03/24/2023)   AHC Utilities    Threatened with loss of utilities: No  Health Literacy: Adequate Health Literacy (03/24/2023)   B1300 Health Literacy    Frequency of need for help with medical instructions: Never   Family Status  Relation Name Status   Mother  Deceased   Father  Deceased   Sister  Alive   Sister  Alive   Sister  Alive   Daughter  Alive   Son  Alive   Son  Alive   Mat Aunt  (Not Specified)   Youth Worker  (Not Specified)  No partnership data on file   Family History  Problem Relation Age of Onset   Colon cancer Mother 40       colon   Cancer Mother    Varicose Veins Mother    Heart disease Father    Hyperlipidemia Father    Hypertension Father    Heart attack Father    CVA Father    Colon polyps Sister    Cancer Maternal Aunt        uterine   Breast cancer Maternal Aunt    Allergies[2]    Patient Care Team: Billy Philippe SAUNDERS, NP as PCP - General (Family Medicine) Shaaron, Lamar HERO, MD as  Consulting Physician (Gastroenterology) Latisha Medford, MD as Consulting Physician (Obstetrics and Gynecology)   Show/hide medication list[3]  Review of Systems  Constitutional: Negative.   HENT: Negative.    Eyes: Negative.   Respiratory: Negative.  Cardiovascular:  Positive for chest pain (Once every 6 months).  Gastrointestinal: Negative.   Genitourinary: Negative.   Musculoskeletal: Negative.   Skin: Negative.   Neurological: Negative.   Psychiatric/Behavioral: Negative.     See HPI above     Objective:   BP 108/70   Pulse (!) 48   Temp 98.4 F (36.9 C) (Oral)   Ht 5' 7 (1.702 m)   Wt 156 lb (70.8 kg)   LMP  (LMP Unknown)   SpO2 95%   BMI 24.43 kg/m    Physical Exam Vitals reviewed.  Constitutional:      General: She is not in acute distress.    Appearance: Normal appearance. She is normal weight. She is not ill-appearing, toxic-appearing or diaphoretic.  HENT:     Head: Normocephalic and atraumatic.     Right Ear: Tympanic membrane and external ear normal. There is no impacted cerumen.     Left Ear: Tympanic membrane and external ear normal. There is no impacted cerumen.     Ears:     Comments: Moderate amount of cerumen in both ear canals.     Nose:     Right Sinus: No maxillary sinus tenderness or frontal sinus tenderness.     Left Sinus: No maxillary sinus tenderness or frontal sinus tenderness.     Mouth/Throat:     Mouth: Mucous membranes are moist.     Pharynx: Oropharynx is clear. Uvula midline. No pharyngeal swelling, oropharyngeal exudate, posterior oropharyngeal erythema or uvula swelling.  Eyes:     General:        Right eye: No discharge.        Left eye: No discharge.     Conjunctiva/sclera: Conjunctivae normal.     Pupils: Pupils are equal, round, and reactive to light.  Neck:     Thyroid : No thyromegaly.  Cardiovascular:     Rate and Rhythm: Normal rate and regular rhythm.     Heart sounds: Normal heart sounds. No murmur heard.     No friction rub. No gallop.  Pulmonary:     Effort: Pulmonary effort is normal. No respiratory distress.     Breath sounds: Normal breath sounds.  Abdominal:     General: Abdomen is flat. Bowel sounds are normal. There is no distension.     Palpations: Abdomen is soft. There is no mass.     Tenderness: There is no abdominal tenderness. There is no right CVA tenderness or left CVA tenderness.  Musculoskeletal:        General: Normal range of motion.     Cervical back: Normal range of motion. No tenderness.     Thoracic back: No tenderness.     Lumbar back: No tenderness.     Right lower leg: No edema.     Left lower leg: No edema.  Lymphadenopathy:     Cervical: No cervical adenopathy.  Skin:    General: Skin is warm and dry.  Neurological:     General: No focal deficit present.     Mental Status: She is alert and oriented to person, place, and time. Mental status is at baseline.     Motor: No weakness.     Gait: Gait normal.  Psychiatric:        Mood and Affect: Mood normal.        Behavior: Behavior normal.        Thought Content: Thought content normal.        Judgment: Judgment normal.  Assessment & Plan:    Routine Health Maintenance and Physical Exam  Immunization History  Administered Date(s) Administered   Influenza,inj,Quad PF,6+ Mos 04/04/2019, 04/05/2020, 04/13/2024   PFIZER Comirnaty(Gray Top)Covid-19 Tri-Sucrose Vaccine 11/08/2020   PFIZER(Purple Top)SARS-COV-2 Vaccination 09/22/2019, 10/14/2019   Tdap 07/15/2011, 08/01/2024   Zoster Recombinant(Shingrix ) 03/03/2022, 05/26/2022    Health Maintenance  Topic Date Due   Pneumococcal Vaccine: 50+ Years (1 of 1 - PCV) Never done   COVID-19 Vaccine (4 - 2025-26 season) 03/14/2024   Cervical Cancer Screening (HPV/Pap Cotest)  04/03/2024   Mammogram  06/02/2024   Colonoscopy  05/23/2031   DTaP/Tdap/Td (3 - Td or Tdap) 08/01/2034   Influenza Vaccine  Completed   HPV VACCINES (No Doses Required) Completed    Hepatitis C Screening  Completed   HIV Screening  Completed   Zoster Vaccines- Shingrix   Completed   Hepatitis B Vaccines 19-59 Average Risk  Aged Out   Meningococcal B Vaccine  Aged Out    Discussed health benefits of physical activity, and encouraged her to engage in regular exercise appropriate for her age and condition.  Annual physical exam -     CBC with Differential/Platelet -     Comprehensive metabolic panel with GFR -     Lipid panel -     TSH  Immunization due -     Tdap vaccine greater than or equal to 7yo IM  1.Review health maintenance  -Tdap vaccine: Administer  -Influenza vaccine: October 2025  -Covid booster: Declines -Pap smear: [facility] request records  -Mammogram: [facility]  -PNA vaccines: Declines  2.Physical exam completed today.  3.Continue a healthy diet and regular exercise.  4.May use over the counter Debrox every 3-6 months for ear canal cleaning. If unsuccessful, follow up for a ear lavage. 5.Ordered labs. Office will call with lab results and will be available via MyChart. Return in about 1 year (around 08/01/2025) for physical.    Monta Police, NP     [1]  Social History Tobacco Use   Smoking status: Never   Smokeless tobacco: Never  Vaping Use   Vaping status: Never Used  Substance Use Topics   Alcohol use: Yes    Alcohol/week: 9.0 standard drinks of alcohol    Types: 7 Glasses of wine, 2 Shots of liquor per week    Comment: 1-2 glasses of wine approx 4 days a week   Drug use: No  [2] No Known Allergies [3]  No outpatient medications prior to visit.   No facility-administered medications prior to visit.   "
